# Patient Record
Sex: Female | Born: 1985 | Race: Black or African American | Hispanic: No | Marital: Single | Smoking: Former smoker
Health system: Southern US, Community
[De-identification: ages and names within clinical notes are randomized; demographics above are authoritative.]

## PROBLEM LIST (undated history)

## (undated) ENCOUNTER — Inpatient Hospital Stay (HOSPITAL_COMMUNITY): Payer: Self-pay

## (undated) DIAGNOSIS — K8020 Calculus of gallbladder without cholecystitis without obstruction: Secondary | ICD-10-CM

## (undated) DIAGNOSIS — F419 Anxiety disorder, unspecified: Secondary | ICD-10-CM

## (undated) DIAGNOSIS — E785 Hyperlipidemia, unspecified: Secondary | ICD-10-CM

## (undated) DIAGNOSIS — I2699 Other pulmonary embolism without acute cor pulmonale: Secondary | ICD-10-CM

## (undated) DIAGNOSIS — O24419 Gestational diabetes mellitus in pregnancy, unspecified control: Secondary | ICD-10-CM

## (undated) DIAGNOSIS — R7303 Prediabetes: Secondary | ICD-10-CM

## (undated) DIAGNOSIS — F32A Depression, unspecified: Secondary | ICD-10-CM

## (undated) DIAGNOSIS — D649 Anemia, unspecified: Secondary | ICD-10-CM

## (undated) DIAGNOSIS — I10 Essential (primary) hypertension: Secondary | ICD-10-CM

## (undated) HISTORY — PX: APPENDECTOMY: SHX54

## (undated) HISTORY — DX: Calculus of gallbladder without cholecystitis without obstruction: K80.20

## (undated) HISTORY — DX: Prediabetes: R73.03

## (undated) HISTORY — DX: Essential (primary) hypertension: I10

## (undated) HISTORY — DX: Hyperlipidemia, unspecified: E78.5

---

## 2009-04-23 ENCOUNTER — Inpatient Hospital Stay (HOSPITAL_COMMUNITY): Admission: EM | Admit: 2009-04-23 | Discharge: 2009-04-28 | Payer: Self-pay | Admitting: Emergency Medicine

## 2009-04-25 ENCOUNTER — Encounter (INDEPENDENT_AMBULATORY_CARE_PROVIDER_SITE_OTHER): Payer: Self-pay | Admitting: Internal Medicine

## 2009-04-25 ENCOUNTER — Ambulatory Visit: Payer: Self-pay | Admitting: Vascular Surgery

## 2011-01-30 LAB — LUPUS ANTICOAGULANT PANEL
DRVVT: 36.2 secs (ref 36.1–47.0)
Lupus Anticoagulant: NOT DETECTED

## 2011-01-30 LAB — CBC
HCT: 37.4 % (ref 36.0–46.0)
HCT: 37.9 % (ref 36.0–46.0)
HCT: 38.8 % (ref 36.0–46.0)
HCT: 40.4 % (ref 36.0–46.0)
HCT: 40.6 % (ref 36.0–46.0)
Hemoglobin: 12.1 g/dL (ref 12.0–15.0)
Hemoglobin: 12.5 g/dL (ref 12.0–15.0)
Hemoglobin: 12.5 g/dL (ref 12.0–15.0)
Hemoglobin: 13.1 g/dL (ref 12.0–15.0)
Hemoglobin: 13.4 g/dL (ref 12.0–15.0)
MCHC: 32.2 g/dL (ref 30.0–36.0)
MCHC: 32.4 g/dL (ref 30.0–36.0)
MCHC: 33 g/dL (ref 30.0–36.0)
MCHC: 33.1 g/dL (ref 30.0–36.0)
MCHC: 33.3 g/dL (ref 30.0–36.0)
MCV: 75.8 fL — ABNORMAL LOW (ref 78.0–100.0)
MCV: 76 fL — ABNORMAL LOW (ref 78.0–100.0)
MCV: 76.8 fL — ABNORMAL LOW (ref 78.0–100.0)
MCV: 76.9 fL — ABNORMAL LOW (ref 78.0–100.0)
Platelets: 248 10*3/uL (ref 150–400)
RBC: 4.87 MIL/uL (ref 3.87–5.11)
RBC: 4.89 MIL/uL (ref 3.87–5.11)
RBC: 4.92 MIL/uL (ref 3.87–5.11)
RBC: 5.31 MIL/uL — ABNORMAL HIGH (ref 3.87–5.11)
RDW: 13.6 % (ref 11.5–15.5)
RDW: 13.7 % (ref 11.5–15.5)
RDW: 13.8 % (ref 11.5–15.5)
RDW: 13.9 % (ref 11.5–15.5)
WBC: 5.8 10*3/uL (ref 4.0–10.5)
WBC: 7.1 10*3/uL (ref 4.0–10.5)

## 2011-01-30 LAB — ANTITHROMBIN III: AntiThromb III Func: 113 % (ref 76–126)

## 2011-01-30 LAB — BASIC METABOLIC PANEL
CO2: 23 mEq/L (ref 19–32)
Chloride: 104 mEq/L (ref 96–112)
Chloride: 108 mEq/L (ref 96–112)
GFR calc Af Amer: >60 mL/min (ref >60)
GFR calc non Af Amer: >60 mL/min (ref >60)
Glucose, Bld: 121 mg/dL — ABNORMAL HIGH (ref 70–99)
Potassium: 4.1 mEq/L (ref 3.5–5.1)
Potassium: 4.3 mEq/L (ref 3.5–5.1)
Sodium: 139 mEq/L (ref 135–145)

## 2011-01-30 LAB — POCT I-STAT, CHEM 8
Calcium, Ion: 1.21 mmol/L (ref 1.12–1.32)
Glucose, Bld: 129 mg/dL — ABNORMAL HIGH (ref 70–99)
HCT: 40 % (ref 36.0–46.0)
Hemoglobin: 13.6 g/dL (ref 12.0–15.0)
Potassium: 3.9 mEq/L (ref 3.5–5.1)
TCO2: 26 mmol/L (ref 0–100)

## 2011-01-30 LAB — HEMOGLOBIN A1C: Hgb A1c MFr Bld: 6.4 % — ABNORMAL HIGH (ref 4.6–6.1)

## 2011-01-30 LAB — D-DIMER, QUANTITATIVE: D-Dimer, Quant: 2.16 ug/mL-FEU — ABNORMAL HIGH (ref 0.00–0.48)

## 2011-01-30 LAB — DIFFERENTIAL
Basophils Absolute: 0 10*3/uL (ref 0.0–0.1)
Basophils Absolute: 0 10*3/uL (ref 0.0–0.1)
Basophils Relative: 1 % (ref 0–1)
Basophils Relative: 1 % (ref 0–1)
Basophils Relative: 1 % (ref 0–1)
Eosinophils Absolute: 0.2 10*3/uL (ref 0.0–0.7)
Eosinophils Absolute: 0.3 10*3/uL (ref 0.0–0.7)
Eosinophils Relative: 3 % (ref 0–5)
Eosinophils Relative: 3 % (ref 0–5)
Eosinophils Relative: 4 % (ref 0–5)
Lymphocytes Relative: 30 % (ref 12–46)
Lymphs Abs: 3 10*3/uL (ref 0.7–4.0)
Monocytes Absolute: 0.4 10*3/uL (ref 0.1–1.0)
Monocytes Relative: 7 % (ref 3–12)
Neutro Abs: 3 10*3/uL (ref 1.7–7.7)
Neutrophils Relative %: 48 % (ref 43–77)

## 2011-01-30 LAB — BETA-2-GLYCOPROTEIN I ABS, IGG/M/A: Beta-2 Glyco I IgG: 4 U/mL (ref <20)

## 2011-01-30 LAB — PROTIME-INR
INR: 1 (ref 0.00–1.49)
INR: 1.2 (ref 0.00–1.49)
INR: 1.4 (ref 0.00–1.49)
Prothrombin Time: 15.4 seconds — ABNORMAL HIGH (ref 11.6–15.2)
Prothrombin Time: 23.1 seconds — ABNORMAL HIGH (ref 11.6–15.2)

## 2011-01-30 LAB — T4, FREE: Free T4: 0.9 ng/dL (ref 0.80–1.80)

## 2011-01-30 LAB — CARDIOLIPIN ANTIBODIES, IGG, IGM, IGA
Anticardiolipin IgA: <7 [APL'U] — ABNORMAL LOW (ref <13)
Anticardiolipin IgG: <7 [GPL'U] — ABNORMAL LOW (ref <11)
Anticardiolipin IgM: <7 [MPL'U] — ABNORMAL LOW (ref <10)

## 2011-03-08 NOTE — H&P (Signed)
NAMESOLSTICE, LASTINGER NO.:  [id number]   MEDICAL RECORD NO.:  [id number]          PATIENT TYPE:  EMS   LOCATION:  MAJO                         FACILITY:  [facility]   PHYSICIAN:  Vania Rea, M.D. DATE OF BIRTH:  [birth year]   DATE OF ADMISSION:  04/23/2009  DATE OF DISCHARGE:                              HISTORY & PHYSICAL   PRIMARY CARE PHYSICIAN:  [facility] in [place].   CHIEF COMPLAINT:  Chest pain and shortness of breath since midday today.   HISTORY OF PRESENT ILLNESS:  This is an obese 25 year old African  American lady who works as a Psychologist, forensic, sitting down all day, and  does smoke 3 to 6 cigarettes per day but has otherwise been in good  health until sudden onset of central chest pain associated with  shortness of breath today.  The pain is worsened by exertion and has not  been improving.  She eventually decided to call the EMS, who brought her  to the emergency room, where she had a CT angiogram done, which  confirmed an acute pulmonary embolus.  The hospitalist service was  called to assist with management.   The patient has no family history of clotting disorder.  She has no  history of recent travel.  She does not use any type of contraceptive.  She does have irregular menstruation.  Her last period was on June 20th.  It was minimal for only 2 days, and she cannot remember when she had  menstruation before that.   PAST MEDICAL HISTORY:  None.   MEDICATIONS:  None.   ALLERGIES:  No known drug allergies.   SOCIAL HISTORY:  She is married with 2 small children.  She smokes 3 to  6 cigarettes per day.  Denies alcohol or illicit drug use.   FAMILY HISTORY:  Significant for hypertension.   REVIEW OF SYSTEMS:  On a 10-point review of systems, other than noted  above, unremarkable.   PHYSICAL EXAMINATION:  An obese young African American lady lying on the  stretcher in no acute distress.  She gives her height as 5 feet  8  inches, and her weight is 318 pounds.  Temperature 98, pulse 96, respirations 20,  blood pressure 136/73.  She  is saturating at 100% on room air.  Pupils are equal and round.  Mucous membranes are pink and anicteric.  She is not dehydrated.  No cervical lymphadenopathy or thyromegaly.  She does have a very thick  neck.  CHEST:  Clear to auscultation bilaterally.  CARDIOVASCULAR:  Regular rhythm.  No murmur heard.  ABDOMEN:  Massively obese but soft and nontender.  EXTREMITIES:  The right lower extremity is slightly enlarged, compared  to the left, but there is no tenderness.  Dorsalis pedis pulses are  equal bilaterally.  SKIN:  Warm and dry without ulcerations.  CENTRAL NERVOUS SYSTEM:  Cranial nerves II-XII are grossly intact.  She  has no focal neurologic deficits.   LABS:  CBC is reviewed.  Her white count is 6.9, hemoglobin 12.8, MCV  76, platelets 248.  She has a  normal differential on her white count.  Her cardiac enzymes are completely normal with undetectable troponins  and CK-MB, and a myoglobin of 60.  Her urine pregnancy test is negative.  Her chemistry is significant for a glucose of 129.  It is otherwise  unremarkable.  Her D-dimer is elevated to 2.16.  Her PT/INR is 13.5 and  1.   Her 2-view chest x-ray shows no acute disease.   CT angiogram of the chest shows bilateral pulmonary emboli with a large  clot in the distal aspect of the left main pulmonary artery.  A clot is  also identified in the descending right interlobar artery.  Heart size  is normal.  Lungs are clear.   ASSESSMENT:  1. Acute bilateral pulmonary embolus.  2. Probable right lower extremity deep venous thrombosis.  3. Tobacco abuse.  4. Morbid obesity.  5. Hyperglycemia.   PLAN:  Will admit this lady for anticoagulation with Lovenox and will  also start Coumadin.  A hypercoagulable workup was ordered started.  Once patient is confirmed to be stable, she can likely be discharged  home on  Lovenox and Coumadin to be followed by her primary care  physician.  Other plans as per orders.      Vania Rea, M.D.  Electronically Signed     LC/MEDQ  D:  04/23/2009  T:  04/23/2009  Job:  [id number]   cc:   [facility] [address] [employer], [place]  Billee Cashing, [place]

## 2011-03-08 NOTE — Discharge Summary (Signed)
Crystal Compton, PINT NO.:  [id number]   MEDICAL RECORD NO.:  [id number]          PATIENT TYPE:  INP   LOCATION:  3740                         FACILITY:  [facility]   PHYSICIAN:  Acey Lav, MD  DATE OF BIRTH:  [birth year]   DATE OF ADMISSION:  04/23/2009  DATE OF DISCHARGE:                               DISCHARGE SUMMARY   INTERIM DISCHARGE SUMMARY:   PRIMARY CARE PHYSICIAN:  [facility] in [place].   DISCHARGE DIAGNOSES:  1. Acute pulmonary embolism.  2. Diabetes mellitus, non-insulin dependent.  3. Morbid obesity.  4. Tobacco use.   PERTINENT LABORATORY DATA AND STUDIES:  CT scan of the chest, CT  angiogram done on April 23, 2009, shows pulmonary embolism, bilateral,  with large clot in the distal aspect of the left main pulmonary artery.  Clot also identified in the descending right interlobular artery.  Dopplers done on April 25, 2009, showed no evidence of deep venous  thrombosis, superficial thrombus involving right lower extremity or left  lower extremity, no evidence of Baker's cyst.  Hypercoagulable panel:  Antithrombin 113, protein-C 92 , functional C 135,  protein S 102 and  functional S 75.  Lupus anticoagulant not detected.  Homocystine 5.2.  Hemoglobin A1c was 6.4.  Thyroid-stimulating hormone 4.052.  Initial  blood glucose on I-stat was 129 and several serum glucoses fasting the  morning were in the 120 range.  Most recent one this morning was 125.   HOSPITAL COURSE:  The patient was admitted on April 23, 2009, to the  hospitalist service after coming in with acute onset of chest pain,  shortness of breath.  Chest pain came on with onset while she was at  rest at work and then continue throughout the day.  It was central and  pleuritic and accompanied by dyspnea.  She came to emergency room where  a CT angiogram showed large pulmonary emboli and the patient was started  on anticoagulation with Lovenox.  She was also  started on Coumadin  according to protocol.  Her INR was 1.9 on April 27, 2009, and we are  rechecking her coags tomorrow.  She is without need of home oxygen and  is hemodynamically stable and when her INR becomes therapeutic, she will  be ready for discharge.   DISCHARGE MEDICATIONS:  1. Coumadin dose to be determined by pharmacy, most recent dose was 10      mg on April 26, 2009.  2. Oxycodone 5 mg as needed every 4 to 6 hours for pain.  3. Ambien 5 mg to 10 mg at time as needed for sleep.   DISCHARGE INSTRUCTIONS:  The patient will need to follow-up with  physician who can monitor INR and make sure this therapeutic.  She will  need follow-up for her diabetes mellitus. I will defer initiation of  metformin for the time-being to her primary care physician.  She may  potentially be able to ameliorate this losing weight.      Acey Lav, MD  Electronically Signed     CV/MEDQ  D:  04/27/2009  T:  04/27/2009  Job:  [id number]

## 2012-09-17 ENCOUNTER — Encounter (HOSPITAL_BASED_OUTPATIENT_CLINIC_OR_DEPARTMENT_OTHER): Payer: Self-pay | Admitting: Family Medicine

## 2012-09-17 ENCOUNTER — Emergency Department (HOSPITAL_BASED_OUTPATIENT_CLINIC_OR_DEPARTMENT_OTHER)
Admission: EM | Admit: 2012-09-17 | Discharge: 2012-09-17 | Disposition: A | Discharge status: Home / Self Care | Payer: Self-pay | Attending: Emergency Medicine | Admitting: Emergency Medicine

## 2012-09-17 DIAGNOSIS — H109 Unspecified conjunctivitis: Secondary | ICD-10-CM

## 2012-09-17 DIAGNOSIS — Z23 Encounter for immunization: Secondary | ICD-10-CM | POA: Insufficient documentation

## 2012-09-17 DIAGNOSIS — H53149 Visual discomfort, unspecified: Secondary | ICD-10-CM | POA: Insufficient documentation

## 2012-09-17 MED ORDER — TETRACAINE HCL 0.5 % OP SOLN
OPHTHALMIC | Status: AC
  Administered 2012-09-17: 11:00:00
  Filled 2012-09-17: qty 2

## 2012-09-17 MED ORDER — OFLOXACIN 0.3 % OP SOLN: 1.0000 [drp] | Freq: Four times a day (QID) | OPHTHALMIC | Status: DC

## 2012-09-17 MED ORDER — OXYCODONE-ACETAMINOPHEN 5-325 MG PO TABS: 2.0000 | ORAL_TABLET | ORAL | Status: DC | PRN

## 2012-09-17 MED ORDER — FLUORESCEIN SODIUM 1 MG OP STRP
ORAL_STRIP | OPHTHALMIC | Status: AC
  Administered 2012-09-17: 11:00:00
  Filled 2012-09-17: qty 1

## 2012-09-17 MED ORDER — TETANUS-DIPHTH-ACELL PERTUSSIS 5-2.5-18.5 LF-MCG/0.5 IM SUSP
0.5000 mL | Freq: Once | INTRAMUSCULAR | Status: AC
  Administered 2012-09-17: 0.5 mL via INTRAMUSCULAR
  Filled 2012-09-17: qty 0.5

## 2012-09-17 MED ORDER — CYCLOPENTOLATE HCL 1 % OP SOLN
2.0000 [drp] | Freq: Once | OPHTHALMIC | Status: AC
  Administered 2012-09-17: 2 [drp] via OPHTHALMIC
  Filled 2012-09-17: qty 2

## 2012-09-17 NOTE — ED Provider Notes (Signed)
History     CSN: [id number]  Arrival date & time 09/17/12  1012   First MD Initiated Contact with Patient 09/17/12 1043      Chief Complaint  Patient presents with  . Eye Problem    (Consider location/radiation/quality/duration/timing/severity/associated sxs/prior treatment) HPI Comments: Patient comes in complaining of a two-day history of light sensitivity pain and watery discharge to her right thigh. She states it's been getting worse since yesterday. She does wear contact lenses and left her contact lenses in for the last week. She took about 2 days ago and the symptoms started after that. She denies any fevers. Denies any crusting of her eyes. Denies any vision changes or eye. She currently does not have an ophthalmologist. She states that she gets her contacts from [place].  Patient is a 26 y.o. female presenting with eye problem.  Eye Problem  Associated symptoms include discharge, photophobia and eye redness. Pertinent negatives include no nausea and no vomiting.    History reviewed. No pertinent past medical history.  History reviewed. No pertinent past surgical history.  No family history on file.  History  Substance Use Topics  . Smoking status: Never Smoker   . Smokeless tobacco: Not on file  . Alcohol Use: Yes    OB History    Grav Para Term Preterm Abortions TAB SAB Ect Mult Living                  Review of Systems  Constitutional: Negative for fever.  Eyes: Positive for photophobia, discharge and redness. Negative for visual disturbance.  Gastrointestinal: Negative for nausea and vomiting.  Skin: Negative for rash and wound.    Allergies  Review of patient's allergies indicates no known allergies.  Home Medications   Current Outpatient Rx  Name  Route  Sig  Dispense  Refill  . OFLOXACIN 0.3 % OP SOLN   Both Eyes   Place 1 drop into both eyes 4 (four) times daily.   5 mL   0   . OXYCODONE-ACETAMINOPHEN 5-325 MG PO TABS   Oral   Take 2  tablets by mouth every 4 (four) hours as needed for pain.   15 tablet   0     BP 142/83  Pulse 67  Temp 98.4 F (36.9 C) (Oral)  Resp 22  Ht 5\' 7"  (1.702 m)  Wt 270 lb (122.471 kg)  BMI 42.29 kg/m2  SpO2 100%  LMP 08/22/2012  Physical Exam  Constitutional: She appears well-developed and well-nourished.  HENT:  Head: Normocephalic.  Mouth/Throat: Oropharynx is clear and moist.  Eyes: Pupils are equal, round, and reactive to light.       Patient diffuse redness to the conjunctiva of the right eye. There some watery discharge. There is photophobia on the right. Extraocular eye movements are intact. There's no foreign bodies noted. Tetracaine and 4 seen was placed in the right eye and patient had a slit lamp exam which did not reveal any staining areas. No obvious corneal abrasion was noted. Visual acuity was noted to be 20/70 in the right eye and 20/100 in the left eye. This was done without any glasses or contacts.    ED Course  Procedures (including critical care time)  Labs Reviewed - No data to display No results found.   1. Conjunctivitis       MDM  Patient diffuse redness and photophobia to the right eye. I don't see a definite corneal abrasion or corneal ulcer. This could represent iritis  versus conjunctivitis. I spoke with Dr. Delaney Meigs who advised placing the patient on Ocuflox eyedrops. Advised patient to throw a all her contacts and contact supplies. I advised her to followup with Dr. Delaney Meigs if her symptoms worsen tomorrow or are not improving within next few days. And I did give her Dr. Ronnie Doss contact information. Her tetanus shot was updated and she was given a perception for pain medicine as well.        Rolan Bucco, MD 09/17/12 [phone]

## 2012-09-17 NOTE — ED Notes (Signed)
Pt sts she wore her contact lenses for a week and was not supposed to. Pt sts she developed pain to eyes yesterday, took out contact lenses and pain has worsened. Pt reports extreme sensitivity to light.

## 2015-04-29 ENCOUNTER — Encounter (HOSPITAL_BASED_OUTPATIENT_CLINIC_OR_DEPARTMENT_OTHER): Payer: Self-pay | Admitting: *Deleted

## 2015-04-29 ENCOUNTER — Emergency Department (HOSPITAL_BASED_OUTPATIENT_CLINIC_OR_DEPARTMENT_OTHER)
Admission: EM | Admit: 2015-04-29 | Discharge: 2015-04-29 | Disposition: A | Discharge status: Home / Self Care | Payer: 59 | Attending: Emergency Medicine | Admitting: Emergency Medicine

## 2015-04-29 DIAGNOSIS — O9989 Other specified diseases and conditions complicating pregnancy, childbirth and the puerperium: Secondary | ICD-10-CM | POA: Insufficient documentation

## 2015-04-29 DIAGNOSIS — O2391 Unspecified genitourinary tract infection in pregnancy, first trimester: Secondary | ICD-10-CM | POA: Diagnosis not present

## 2015-04-29 DIAGNOSIS — Z3A08 8 weeks gestation of pregnancy: Secondary | ICD-10-CM | POA: Insufficient documentation

## 2015-04-29 DIAGNOSIS — R1030 Lower abdominal pain, unspecified: Secondary | ICD-10-CM | POA: Insufficient documentation

## 2015-04-29 DIAGNOSIS — N390 Urinary tract infection, site not specified: Secondary | ICD-10-CM

## 2015-04-29 DIAGNOSIS — Z86711 Personal history of pulmonary embolism: Secondary | ICD-10-CM | POA: Diagnosis not present

## 2015-04-29 DIAGNOSIS — Z3490 Encounter for supervision of normal pregnancy, unspecified, unspecified trimester: Secondary | ICD-10-CM

## 2015-04-29 HISTORY — DX: Other pulmonary embolism without acute cor pulmonale: I26.99

## 2015-04-29 LAB — CBC WITH DIFFERENTIAL/PLATELET
BASOS ABS: 0 10*3/uL (ref 0.0–0.1)
BASOS PCT: 0 % (ref 0–1)
EOS ABS: 0.1 10*3/uL (ref 0.0–0.7)
EOS PCT: 1 % (ref 0–5)
HEMATOCRIT: 35.9 % — AB (ref 36.0–46.0)
HEMOGLOBIN: 11.9 g/dL — AB (ref 12.0–15.0)
LYMPHS PCT: 31 % (ref 12–46)
Lymphs Abs: 2.7 10*3/uL (ref 0.7–4.0)
MCH: 24.7 pg — ABNORMAL LOW (ref 26.0–34.0)
MCHC: 33.1 g/dL (ref 30.0–36.0)
MCV: 74.6 fL — ABNORMAL LOW (ref 78.0–100.0)
MONO ABS: 0.6 10*3/uL (ref 0.1–1.0)
MONOS PCT: 7 % (ref 3–12)
NEUTROS ABS: 5.2 10*3/uL (ref 1.7–7.7)
NEUTROS PCT: 61 % (ref 43–77)
Platelets: 286 10*3/uL (ref 150–400)
RBC: 4.81 MIL/uL (ref 3.87–5.11)
RDW: 13.7 % (ref 11.5–15.5)
WBC: 8.6 10*3/uL (ref 4.0–10.5)

## 2015-04-29 LAB — BASIC METABOLIC PANEL
Anion gap: 8 (ref 5–15)
BUN: 12 mg/dL (ref 6–20)
CO2: 21 mmol/L — ABNORMAL LOW (ref 22–32)
CREATININE: 0.67 mg/dL (ref 0.44–1.00)
Calcium: 8.9 mg/dL (ref 8.9–10.3)
Chloride: 105 mmol/L (ref 101–111)
GFR calc Af Amer: >60 mL/min (ref >60)
GFR calc non Af Amer: >60 mL/min (ref >60)
Glucose, Bld: 83 mg/dL (ref 65–99)
Potassium: 4 mmol/L (ref 3.5–5.1)
SODIUM: 134 mmol/L — AB (ref 135–145)

## 2015-04-29 LAB — URINALYSIS, ROUTINE W REFLEX MICROSCOPIC
BILIRUBIN URINE: NEGATIVE
GLUCOSE, UA: NEGATIVE mg/dL
KETONES UR: 15 mg/dL — AB
Nitrite: NEGATIVE
Protein, ur: NEGATIVE mg/dL
Specific Gravity, Urine: 1.037 — ABNORMAL HIGH (ref 1.005–1.030)
Urobilinogen, UA: 1 mg/dL (ref 0.0–1.0)
pH: 6 (ref 5.0–8.0)

## 2015-04-29 LAB — URINE MICROSCOPIC-ADD ON

## 2015-04-29 LAB — PREGNANCY, URINE: PREG TEST UR: POSITIVE — AB

## 2015-04-29 MED ORDER — PROMETHAZINE HCL 25 MG PO TABS: 25.0000 mg | ORAL_TABLET | Freq: Four times a day (QID) | ORAL | Status: DC | PRN

## 2015-04-29 MED ORDER — ONDANSETRON HCL 4 MG/2ML IJ SOLN
4.0000 mg | Freq: Once | INTRAMUSCULAR | Status: AC
  Administered 2015-04-29: 4 mg via INTRAVENOUS
  Filled 2015-04-29: qty 2

## 2015-04-29 MED ORDER — SODIUM CHLORIDE 0.9 % IV BOLUS (SEPSIS)
2000.0000 mL | Freq: Once | INTRAVENOUS | Status: AC
  Administered 2015-04-29: 2000 mL via INTRAVENOUS

## 2015-04-29 MED ORDER — CEFTRIAXONE SODIUM 1 G IJ SOLR
INTRAMUSCULAR | Status: AC
  Filled 2015-04-29: qty 10

## 2015-04-29 MED ORDER — CEPHALEXIN 500 MG PO CAPS
500.0000 mg | ORAL_CAPSULE | Freq: Three times a day (TID) | ORAL | Status: DC
Start: 2015-04-29 — End: 2016-06-06

## 2015-04-29 MED ORDER — CEPHALEXIN 500 MG PO CAPS: 500.0000 mg | ORAL_CAPSULE | Freq: Three times a day (TID) | ORAL | Status: DC

## 2015-04-29 MED ORDER — DEXTROSE 5 % IV SOLN
1.0000 g | Freq: Once | INTRAVENOUS | Status: AC
  Administered 2015-04-29: 1 g via INTRAVENOUS

## 2015-04-29 NOTE — Discharge Instructions (Signed)

## 2015-04-29 NOTE — ED Provider Notes (Signed)
CSN: [id number]     Arrival date & time 04/29/15  1804 History  This chart was scribed for Rolland PorterMark Kenetra Hildenbrand, MD by Chestine SporeSoijett Blue, ED Scribe. The patient was seen in room MH09/MH09 at 6:28 PM      Chief Complaint  Patient presents with  . Abdominal Pain      The history is provided by the patient. No language interpreter was used.    Jim LikeLaportia Yoo is a 29 y.o. female who presents to the Emergency Department complaining of abdominal pain onset 3 weeks. Pt reports that she took a home pregnancy test and she thinks that she is about [redacted] weeks pregnant and that is when her LMP was. Pt had a positive pregnancy test 3 weeks ago. Pt is having associated symptoms of nausea, weakness, dizziness, constipation, and cramping. Pt reports that this is her fouth pregnancy and she did experience the same symptoms with her other pregnancies. She denies vomiting, urinary issues, dysuria, vaginal bleeding, fever, and any other symptoms. Pt denies stomach or gallbladder issues.    Past Medical History  Diagnosis Date  . PE (pulmonary embolism)    Past Surgical History  Procedure Laterality Date  . Appendectomy     No family history on file. History  Substance Use Topics  . Smoking status: Never Smoker   . Smokeless tobacco: Not on file  . Alcohol Use: Yes   OB History    Gravida Para Term Preterm AB TAB SAB Ectopic Multiple Living   4 2 2  1           Review of Systems  Constitutional: Negative for fever, chills, diaphoresis, appetite change and fatigue.  HENT: Negative for mouth sores, sore throat and trouble swallowing.   Eyes: Negative for visual disturbance.  Respiratory: Negative for cough, chest tightness, shortness of breath and wheezing.   Cardiovascular: Negative for chest pain.  Gastrointestinal: Positive for nausea, abdominal pain and constipation. Negative for vomiting, diarrhea and abdominal distention.  Endocrine: Negative for polydipsia, polyphagia and polyuria.  Genitourinary: Negative  for dysuria, frequency, hematuria, decreased urine volume and vaginal bleeding.  Musculoskeletal: Negative for gait problem.  Skin: Negative for color change, pallor and rash.  Neurological: Positive for weakness. Negative for dizziness, syncope, light-headedness and headaches.  Hematological: Does not bruise/bleed easily.  Psychiatric/Behavioral: Negative for behavioral problems and confusion.      Allergies  Review of patient's allergies indicates no known allergies.  Home Medications   Prior to Admission medications   Medication Sig Start Date End Date Taking? Authorizing Provider  cephALEXin (KEFLEX) 500 MG capsule Take 1 capsule (500 mg total) by mouth 3 (three) times daily. 04/29/15   Rolland PorterMark Geary Rufo, MD  ofloxacin (OCUFLOX) 0.3 % ophthalmic solution Place 1 drop into both eyes 4 (four) times daily. 09/17/12   Rolan BuccoMelanie Belfi, MD  oxyCODONE-acetaminophen (PERCOCET/ROXICET) 5-325 MG per tablet Take 2 tablets by mouth every 4 (four) hours as needed for pain. 09/17/12   Rolan BuccoMelanie Belfi, MD  promethazine (PHENERGAN) 25 MG tablet Take 1 tablet (25 mg total) by mouth every 6 (six) hours as needed for nausea or vomiting. 04/29/15   Rolland PorterMark Moya Duan, MD   BP 130/79 mmHg  Pulse 64  Temp(Src) 98.4 F (36.9 C) (Oral)  Resp 18  Ht 5\' 7"  (1.702 m)  Wt 270 lb (122.471 kg)  BMI 42.28 kg/m2  SpO2 100%  LMP 03/06/2015 Physical Exam  Constitutional: She is oriented to person, place, and time. She appears well-developed and well-nourished. No distress.  HENT:  Head: Normocephalic.  Eyes: Conjunctivae are normal. Pupils are equal, round, and reactive to light. No scleral icterus.  Neck: Normal range of motion. Neck supple. No thyromegaly present.  Cardiovascular: Normal rate and regular rhythm.  Exam reveals no gallop and no friction rub.   No murmur heard. Pulmonary/Chest: Effort normal and breath sounds normal. No respiratory distress. She has no wheezes. She has no rales.  Abdominal: Soft. Bowel sounds  are normal. She exhibits no distension. There is tenderness in the suprapubic area. There is no rebound.  Minimal suprapubic tenderness  Musculoskeletal: Normal range of motion.  Neurological: She is alert and oriented to person, place, and time.  Skin: Skin is warm and dry. No rash noted.  Psychiatric: She has a normal mood and affect. Her behavior is normal.  Nursing note and vitals reviewed.   ED Course  Procedures (including critical care time) DIAGNOSTIC STUDIES: Oxygen Saturation is 100% on RA, nl by my interpretation.    COORDINATION OF CARE: 6:33 PM-Discussed treatment plan which includes UA with pt at bedside and pt agreed to plan.   Labs Review Labs Reviewed  URINALYSIS, ROUTINE W REFLEX MICROSCOPIC (NOT AT [facility]) - Abnormal; Notable for the following:    APPearance CLOUDY (*)    Specific Gravity, Urine 1.037 (*)    Hgb urine dipstick MODERATE (*)    Ketones, ur 15 (*)    Leukocytes, UA MODERATE (*)    All other components within normal limits  PREGNANCY, URINE - Abnormal; Notable for the following:    Preg Test, Ur POSITIVE (*)    All other components within normal limits  CBC WITH DIFFERENTIAL/PLATELET - Abnormal; Notable for the following:    Hemoglobin 11.9 (*)    HCT 35.9 (*)    MCV 74.6 (*)    MCH 24.7 (*)    All other components within normal limits  BASIC METABOLIC PANEL - Abnormal; Notable for the following:    Sodium 134 (*)    CO2 21 (*)    All other components within normal limits  URINE MICROSCOPIC-ADD ON - Abnormal; Notable for the following:    Bacteria, UA MANY (*)    All other components within normal limits  URINE CULTURE    Imaging Review No results found.   EKG Interpretation None      MDM   Final diagnoses:  Pregnancy  UTI (lower urinary tract infection)    Bedside US shows IUP with FHT 128.  Tests show UTI.  Hydrated.  Taking PO.  Plan home, push fluids, antiemetics, antibiotics, OB f.u.  I personally performed the services  described in this documentation, which was scribed in my presence. The recorded information has been reviewed and is accurate.    Rolland Porter, MD 04/29/15 1949

## 2015-04-29 NOTE — ED Notes (Signed)
MD at bedside. 

## 2015-04-29 NOTE — ED Notes (Signed)
Pt c/o abd cramping, N/V, weakness, and dizziness x3 weeks.

## 2015-04-29 NOTE — ED Notes (Signed)
Pt believes she is about [redacted] weeks pregnant.

## 2015-05-01 LAB — URINE CULTURE: Special Requests: NORMAL

## 2015-05-08 NOTE — ED Notes (Signed)
Patient called to state she is having pain in her arm where the IV was started last week.  States she has soreness in the entire arm.  Denies redness, swelling or warmth.  Encouraged to apply warm compresses every two hours through out the day and to return to the ED immediately if develops redness, swelling, warmness or fever.

## 2015-10-13 ENCOUNTER — Encounter: Payer: Self-pay | Admitting: Advanced Practice Midwife

## 2015-10-14 ENCOUNTER — Encounter: Payer: Self-pay | Admitting: Obstetrics and Gynecology

## 2015-10-14 ENCOUNTER — Ambulatory Visit (INDEPENDENT_AMBULATORY_CARE_PROVIDER_SITE_OTHER): Payer: Medicaid Other | Admitting: Obstetrics and Gynecology

## 2015-10-14 ENCOUNTER — Other Ambulatory Visit (HOSPITAL_COMMUNITY)
Admission: RE | Admit: 2015-10-14 | Discharge: 2015-10-14 | Disposition: A | Discharge status: Home / Self Care | Payer: Medicaid Other | Source: Ambulatory Visit | Attending: Obstetrics and Gynecology | Admitting: Obstetrics and Gynecology

## 2015-10-14 VITALS — BP 130/77 | HR 84 | Temp 98.3°F | Wt 269.9 lb

## 2015-10-14 DIAGNOSIS — Z01419 Encounter for gynecological examination (general) (routine) without abnormal findings: Secondary | ICD-10-CM

## 2015-10-14 DIAGNOSIS — Z0000 Encounter for general adult medical examination without abnormal findings: Secondary | ICD-10-CM

## 2015-10-14 DIAGNOSIS — Z1151 Encounter for screening for human papillomavirus (HPV): Secondary | ICD-10-CM | POA: Diagnosis not present

## 2015-10-14 DIAGNOSIS — Z124 Encounter for screening for malignant neoplasm of cervix: Secondary | ICD-10-CM | POA: Diagnosis not present

## 2015-10-14 DIAGNOSIS — Z113 Encounter for screening for infections with a predominantly sexual mode of transmission: Secondary | ICD-10-CM

## 2015-10-14 LAB — HEMOGLOBIN A1C
Hgb A1c MFr Bld: 5.8 % — ABNORMAL HIGH (ref <5.7)
Mean Plasma Glucose: 120 mg/dL — ABNORMAL HIGH (ref <117)

## 2015-10-14 LAB — TSH: TSH: 2.517 u[IU]/mL (ref 0.350–4.500)

## 2015-10-14 MED ORDER — SERTRALINE HCL 100 MG PO TABS: 100.0000 mg | ORAL_TABLET | Freq: Every day | ORAL | Status: DC

## 2015-10-14 NOTE — Progress Notes (Signed)
  Subjective:     Crystal Compton is a 29 y.o. female with BMI 42 who is here for a comprehensive physical exam. The patient reports multiple complaints. She describes experiencing some depression. She has never been treated for it in the past. She denies any suicidal/homicidal ideations and desires to be seen by a provider. She also reports monthly lower pelvic pain which occur 2 weeks following her menstrual cycle. Her menses are monthly lasting for 5 days. She is sexually active without contraception and would welcome a pregnancy. Patient would like to be screened for STD, as well as diabetes.  Past Medical History  Diagnosis Date  . PE (pulmonary embolism)    Past Surgical History  Procedure Laterality Date  . Appendectomy     No family history on file.  Social History   Social History  . Marital Status: Single    Spouse Name: N/A  . Number of Children: N/A  . Years of Education: N/A   Occupational History  . Not on file.   Social History Main Topics  . Smoking status: Never Smoker   . Smokeless tobacco: Not on file  . Alcohol Use: Yes  . Drug Use: No  . Sexual Activity: Not on file   Other Topics Concern  . Not on file   Social History Narrative   Health Maintenance  Topic Date Due  . HIV Screening  06/28/2000  . PAP SMEAR  06/28/2006  . INFLUENZA VACCINE  05/25/2015  . TETANUS/TDAP  09/17/2022       Review of Systems A comprehensive review of systems was negative.   Objective:      GENERAL: Well-developed, well-nourished female in no acute distress. Obese HEENT: Normocephalic, atraumatic. Sclerae anicteric.  NECK: Supple. Normal thyroid.  LUNGS: Clear to auscultation bilaterally.  HEART: Regular rate and rhythm. BREASTS: Symmetric in size. No palpable masses or lymphadenopathy, skin changes, or nipple drainage. ABDOMEN: Soft, nontender, nondistended. No organomegaly. PELVIC: Normal external female genitalia. Vagina is pink and rugated.  Normal  discharge. Normal appearing cervix. Uterus is normal in size. No adnexal mass or tenderness. EXTREMITIES: No cyanosis, clubbing, or edema, 2+ distal pulses.    Assessment:    Healthy female exam.      Plan:    Pap smear collected with cultures HIV, Syphilis and hepatitis screen ordered HgA1C and TSH ordered Rx Zoloft provided as well as a referral to behavioral health Reassurance provided regarding her monthly abdominal pain which is likely ovulatory pain Discussed weight loss management with diet and exercise (at least 150 minutes/week) Patient will be contacted with any abnormal results RTC prn See After Visit Summary for Counseling Recommendations

## 2015-10-15 ENCOUNTER — Telehealth: Payer: Self-pay

## 2015-10-15 ENCOUNTER — Other Ambulatory Visit: Payer: Self-pay

## 2015-10-15 LAB — WET PREP, GENITAL
Trich, Wet Prep: NONE SEEN
Yeast Wet Prep HPF POC: NONE SEEN

## 2015-10-15 LAB — RPR

## 2015-10-15 LAB — CYTOLOGY - PAP

## 2015-10-15 LAB — HIV ANTIBODY (ROUTINE TESTING W REFLEX): HIV 1&2 Ab, 4th Generation: NONREACTIVE

## 2015-10-15 LAB — URINE CULTURE: Colony Count: 40000

## 2015-10-15 LAB — HEPATITIS C ANTIBODY: HCV Ab: NEGATIVE

## 2015-10-15 LAB — HEPATITIS B SURFACE ANTIGEN: Hepatitis B Surface Ag: NEGATIVE

## 2015-10-15 MED ORDER — METRONIDAZOLE 500 MG PO TABS: 500.0000 mg | ORAL_TABLET | Freq: Two times a day (BID) | ORAL | Status: DC

## 2015-10-15 NOTE — Telephone Encounter (Signed)
Spoke with patient concerning results on lab work. Flagyl has been called into the pharmacy.

## 2015-10-15 NOTE — Telephone Encounter (Signed)
Pt notified of results and rx has been called into pharmacy

## 2015-10-15 NOTE — Telephone Encounter (Signed)
-----   Message from Catalina AntiguaPeggy Constant, MD sent at 10/15/2015  8:31 AM EST ----- Please inform patient of BV infection. Please call in Flagyl 500mg  BID for 7 days. (no pharmacy on file to e-prescribe)  Please inform patient that although she does not have diabetes, she is in high risk for the development of diabetes. Lifestyle modifications such as a better diet and exercise (as we discussed) will help prevent the development of that disease.  Thanks  K[employer] Morgan EnergyPeggy

## 2015-10-20 ENCOUNTER — Telehealth: Payer: Self-pay | Admitting: *Deleted

## 2015-10-20 NOTE — Telephone Encounter (Signed)
Per Dr. Jolayne Pantheronstant: Please inform patient of trich infection. The antibiotic received to treat BV should treat her trich as well. She should however, inform her partner and have him treated and abstain from intercourse until they have both completed treatment Called patient and left message that we are calling with results.

## 2015-10-21 NOTE — Telephone Encounter (Signed)
Spoke with patient she is aware of trich infection and will tell her partner as well.

## 2016-05-17 ENCOUNTER — Other Ambulatory Visit (HOSPITAL_COMMUNITY): Payer: Self-pay | Admitting: Obstetrics and Gynecology

## 2016-05-17 DIAGNOSIS — Z3689 Encounter for other specified antenatal screening: Secondary | ICD-10-CM

## 2016-05-17 DIAGNOSIS — F1290 Cannabis use, unspecified, uncomplicated: Secondary | ICD-10-CM

## 2016-05-18 ENCOUNTER — Encounter (HOSPITAL_COMMUNITY): Payer: Self-pay

## 2016-05-19 ENCOUNTER — Encounter (HOSPITAL_COMMUNITY): Payer: Self-pay

## 2016-05-19 ENCOUNTER — Ambulatory Visit (HOSPITAL_COMMUNITY)
Admission: RE | Admit: 2016-05-19 | Discharge: 2016-05-19 | Disposition: A | Discharge status: Home / Self Care | Payer: Medicaid Other | Source: Ambulatory Visit | Attending: Obstetrics and Gynecology | Admitting: Obstetrics and Gynecology

## 2016-05-19 VITALS — BP 121/72 | HR 128 | Wt 296.4 lb

## 2016-05-19 DIAGNOSIS — Z3689 Encounter for other specified antenatal screening: Secondary | ICD-10-CM

## 2016-05-19 DIAGNOSIS — Z36 Encounter for antenatal screening of mother: Secondary | ICD-10-CM | POA: Insufficient documentation

## 2016-05-19 DIAGNOSIS — O09213 Supervision of pregnancy with history of pre-term labor, third trimester: Secondary | ICD-10-CM | POA: Diagnosis not present

## 2016-05-19 DIAGNOSIS — Z3A28 28 weeks gestation of pregnancy: Secondary | ICD-10-CM | POA: Diagnosis not present

## 2016-05-19 DIAGNOSIS — O09293 Supervision of pregnancy with other poor reproductive or obstetric history, third trimester: Secondary | ICD-10-CM | POA: Diagnosis not present

## 2016-05-19 DIAGNOSIS — O99213 Obesity complicating pregnancy, third trimester: Secondary | ICD-10-CM | POA: Diagnosis not present

## 2016-05-19 DIAGNOSIS — O24415 Gestational diabetes mellitus in pregnancy, controlled by oral hypoglycemic drugs: Secondary | ICD-10-CM | POA: Diagnosis not present

## 2016-05-19 DIAGNOSIS — O24919 Unspecified diabetes mellitus in pregnancy, unspecified trimester: Secondary | ICD-10-CM

## 2016-05-19 DIAGNOSIS — F1290 Cannabis use, unspecified, uncomplicated: Secondary | ICD-10-CM

## 2016-05-19 HISTORY — DX: Gestational diabetes mellitus in pregnancy, unspecified control: O24.419

## 2016-05-19 HISTORY — DX: Anemia, unspecified: D64.9

## 2016-06-06 ENCOUNTER — Encounter (HOSPITAL_COMMUNITY): Payer: Self-pay | Admitting: *Deleted

## 2016-06-06 ENCOUNTER — Inpatient Hospital Stay (HOSPITAL_COMMUNITY)
Admission: AD | Admit: 2016-06-06 | Discharge: 2016-06-06 | Disposition: A | Discharge status: Home / Self Care | Payer: Medicaid Other | Source: Ambulatory Visit | Attending: Obstetrics & Gynecology | Admitting: Obstetrics & Gynecology

## 2016-06-06 DIAGNOSIS — L299 Pruritus, unspecified: Secondary | ICD-10-CM | POA: Diagnosis present

## 2016-06-06 DIAGNOSIS — Z86711 Personal history of pulmonary embolism: Secondary | ICD-10-CM | POA: Insufficient documentation

## 2016-06-06 DIAGNOSIS — O26893 Other specified pregnancy related conditions, third trimester: Secondary | ICD-10-CM | POA: Insufficient documentation

## 2016-06-06 DIAGNOSIS — Z3A31 31 weeks gestation of pregnancy: Secondary | ICD-10-CM | POA: Insufficient documentation

## 2016-06-06 LAB — COMPREHENSIVE METABOLIC PANEL
ALK PHOS: 93 U/L (ref 38–126)
ALT: 10 U/L — AB (ref 14–54)
ANION GAP: 8 (ref 5–15)
AST: 12 U/L — ABNORMAL LOW (ref 15–41)
Albumin: 3.3 g/dL — ABNORMAL LOW (ref 3.5–5.0)
BUN: 7 mg/dL (ref 6–20)
CO2: 21 mmol/L — ABNORMAL LOW (ref 22–32)
CREATININE: 0.54 mg/dL (ref 0.44–1.00)
Calcium: 9.4 mg/dL (ref 8.9–10.3)
Chloride: 106 mmol/L (ref 101–111)
GFR calc Af Amer: >60 mL/min (ref >60)
GFR calc non Af Amer: >60 mL/min (ref >60)
GLUCOSE: 86 mg/dL (ref 65–99)
Potassium: 4 mmol/L (ref 3.5–5.1)
Sodium: 135 mmol/L (ref 135–145)
TOTAL PROTEIN: 6.8 g/dL (ref 6.5–8.1)
Total Bilirubin: 0.4 mg/dL (ref 0.3–1.2)

## 2016-06-06 NOTE — MAU Note (Signed)
Pt has had itching in her hands and arms and legs and a rash appears after her scratching.  She said it goes away about ten minutes later.  Has had some follow up with her OB in [place] but still has some symptoms and wants to be retested for her symptoms due to her concerns.

## 2016-06-06 NOTE — Discharge Instructions (Signed)
Pruritus °Pruritus is an itching feeling. There are many different conditions and factors that can make your skin itchy. Dry skin is one of the most common causes of itching. Most cases of itching do not require medical attention. Itchy skin can turn into a rash.  °HOME CARE INSTRUCTIONS  °Watch your pruritus for any changes. Take these steps to help with your condition:  °Skin Care °· Moisturize your skin as needed. A moisturizer that contains petroleum jelly is best for keeping moisture in your skin. °· Take or apply medicines only as directed by your health care provider. This may include: °¨ Corticosteroid cream. °¨ Anti-itch lotions. °¨ Oral anti-histamines. °· Apply cool compresses to the affected areas. °· Try taking a bath with: °¨ Epsom salts. Follow the instructions on the packaging. You can get these at your local pharmacy or grocery store. °¨ Baking soda. Pour a small amount into the bath as directed by your health care provider. °¨ Colloidal oatmeal. Follow the instructions on the packaging. You can get this at your local pharmacy or grocery store. °· Try applying baking soda paste to your skin. Stir water into baking soda until it reaches a paste-like consistency.   °· Do not scratch your skin. °· Avoid hot showers or baths, which can make itching worse. A cold shower may help with itching as long as you use a moisturizer after. °· Avoid scented soaps, detergents, and perfumes. Use gentle soaps, detergents, perfumes, and other cosmetic products. °General Instructions °· Avoid wearing tight clothes. °· Keep a journal to help track what causes your itch. Write down: °¨ What you eat. °¨ What cosmetic products you use. °¨ What you drink. °¨ What you wear. This includes jewelry. °· Use a humidifier. This keeps the air moist, which helps to prevent dry skin. °SEEK MEDICAL CARE IF: °· The itching does not go away after several days. °· You sweat at night. °· You have weight loss. °· You are unusually  thirsty. °· You urinate more than normal. °· You are more tired than normal. °· You have abdominal pain. °· Your skin tingles. °· You feel weak. °· Your skin or the whites of your eyes look yellow (jaundice). °· Your skin feels numb. °  °This information is not intended to replace advice given to you by your health care provider. Make sure you discuss any questions you have with your health care provider. °  °Document Released: 06/22/2011 Document Revised: 02/24/2015 Document Reviewed: 10/06/2014 °Elsevier Interactive Patient Education ©2016 [employer] ° °

## 2016-06-06 NOTE — MAU Provider Note (Signed)
History     CSN: [id number]  Arrival date and time: 06/06/16 1656   First Provider Initiated Contact with Patient 06/06/16 1812      Chief Complaint  Patient presents with  . Pruritis   HPI Ms. Crystal Compton is a 30 y.o. G4P2010 at 7529w2d who presents to MAU today with complaint of itching. The patient states that she has been having progressively worsening diffuse itching for 1-2 weeks. She had her OB, Dr. Shawnie Ponsorn, check bile acids recently and was called and told results were normal. She took Benadryl x 2 days which relieved her symptoms, but did not take any today and is itching again. She denies N/V, regular contractions, vaginal bleeding, LOF today. She reports good fetal movement. She would like labs redrawn because she is not convinced that she does not have cholestasis.   OB History    Gravida Para Term Preterm AB Living   4 2 2   1      SAB TAB Ectopic Multiple Live Births                  Past Medical History:  Diagnosis Date  . Anemia   . Gestational diabetes   . PE (pulmonary embolism)     Past Surgical History:  Procedure Laterality Date  . APPENDECTOMY      No family history on file.  Social History  Substance Use Topics  . Smoking status: Never Smoker  . Smokeless tobacco: Never Used  . Alcohol use Yes    Allergies: No Known Allergies  No prescriptions prior to admission.    Review of Systems  Constitutional: Negative for fever and malaise/fatigue.  Gastrointestinal: Negative for abdominal pain, nausea and vomiting.  Genitourinary:       Neg - vaginal bleeding, discharge, LOF  Skin: Positive for itching. Negative for rash.   Physical Exam   Blood pressure 112/73, pulse 95, temperature 98.3 F (36.8 C), temperature source Oral, resp. rate 16, last menstrual period 10/27/2014.  Physical Exam  Nursing note and vitals reviewed. Constitutional: She is oriented to person, place, and time. She appears well-developed and well-nourished. No distress.   HENT:  Head: Normocephalic and atraumatic.  Eyes: Conjunctivae are normal.  Cardiovascular: Normal rate.   Respiratory: Effort normal.  GI: Soft. She exhibits no distension.  Neurological: She is alert and oriented to person, place, and time.  Skin: Skin is warm and dry. No erythema.  Psychiatric: She has a normal mood and affect.    Results for orders placed or performed during the hospital encounter of 06/06/16 (from the past 24 hour(s))  Comprehensive metabolic panel     Status: Abnormal   Collection Time: 06/06/16  6:09 PM  Result Value Ref Range   Sodium 135 135 - 145 mmol/L   Potassium 4.0 3.5 - 5.1 mmol/L   Chloride 106 101 - 111 mmol/L   CO2 21 (L) 22 - 32 mmol/L   Glucose, Bld 86 65 - 99 mg/dL   BUN 7 6 - 20 mg/dL   Creatinine, Ser 9.520.54 0.44 - 1.00 mg/dL   Calcium 9.4 8.9 - 84.110.3 mg/dL   Total Protein 6.8 6.5 - 8.1 g/dL   Albumin 3.3 (L) 3.5 - 5.0 g/dL   AST 12 (L) 15 - 41 U/L   ALT 10 (L) 14 - 54 U/L   Alkaline Phosphatase 93 38 - 126 U/L   Total Bilirubin 0.4 0.3 - 1.2 mg/dL   GFR calc non Af Amer >60 >60 mL/min  GFR calc Af Amer >60 >60 mL/min   Anion gap 8 5 - 15    MAU Course  Procedures None  MDM CMP and Bile acids drawn. Patient advised that results would not be available today. Discussed importance of staying with one provider for care during pregnancy. Advised that she could transfer to our care if desired. Patient will wait on lab results first.  LFTs normal Bile acids pending Assessment and Plan  A: SIUP at 4712w2d Diffuse pruritis  P: Discharge home Advised to continue Benadryl PRN for itching Warning signs for worsening condition discussed Patient advised to follow-up with Dr. Shawnie Ponsorn as scheduled or sooner if symptoms worsen Discussed importance of consistent care in pregnancy Patient may return to MAU as needed or if her condition were to change or worsen   Marny LowensteinJulie N Wenzel, PA-C  06/06/2016, 7:54 PM

## 2016-06-07 LAB — BILE ACIDS, TOTAL: Bile Acids Total: 9.1 umol/L (ref 4.7–24.5)

## 2016-06-15 ENCOUNTER — Ambulatory Visit (HOSPITAL_COMMUNITY)
Admission: RE | Admit: 2016-06-15 | Discharge: 2016-06-15 | Disposition: A | Discharge status: Home / Self Care | Payer: Medicaid Other | Source: Ambulatory Visit | Attending: Obstetrics and Gynecology | Admitting: Obstetrics and Gynecology

## 2016-06-15 ENCOUNTER — Encounter (HOSPITAL_COMMUNITY): Payer: Self-pay

## 2016-06-15 VITALS — BP 123/76 | HR 109 | Wt 297.4 lb

## 2016-06-15 DIAGNOSIS — O24414 Gestational diabetes mellitus in pregnancy, insulin controlled: Secondary | ICD-10-CM | POA: Diagnosis present

## 2016-06-15 DIAGNOSIS — O09213 Supervision of pregnancy with history of pre-term labor, third trimester: Secondary | ICD-10-CM | POA: Insufficient documentation

## 2016-06-15 DIAGNOSIS — O24415 Gestational diabetes mellitus in pregnancy, controlled by oral hypoglycemic drugs: Secondary | ICD-10-CM | POA: Insufficient documentation

## 2016-06-15 DIAGNOSIS — Z3A32 32 weeks gestation of pregnancy: Secondary | ICD-10-CM | POA: Insufficient documentation

## 2016-06-15 DIAGNOSIS — Z36 Encounter for antenatal screening of mother: Secondary | ICD-10-CM | POA: Insufficient documentation

## 2016-06-15 DIAGNOSIS — O24919 Unspecified diabetes mellitus in pregnancy, unspecified trimester: Secondary | ICD-10-CM

## 2016-06-15 DIAGNOSIS — O09293 Supervision of pregnancy with other poor reproductive or obstetric history, third trimester: Secondary | ICD-10-CM | POA: Diagnosis not present

## 2016-07-13 ENCOUNTER — Encounter (HOSPITAL_COMMUNITY): Payer: Self-pay

## 2016-07-13 ENCOUNTER — Ambulatory Visit (HOSPITAL_COMMUNITY)
Admission: RE | Admit: 2016-07-13 | Discharge: 2016-07-13 | Disposition: A | Discharge status: Home / Self Care | Payer: Medicaid Other | Source: Ambulatory Visit | Attending: Obstetrics and Gynecology | Admitting: Obstetrics and Gynecology

## 2016-07-13 DIAGNOSIS — O2690 Pregnancy related conditions, unspecified, unspecified trimester: Secondary | ICD-10-CM | POA: Insufficient documentation

## 2016-07-13 DIAGNOSIS — O09219 Supervision of pregnancy with history of pre-term labor, unspecified trimester: Secondary | ICD-10-CM | POA: Insufficient documentation

## 2016-07-13 DIAGNOSIS — O24414 Gestational diabetes mellitus in pregnancy, insulin controlled: Secondary | ICD-10-CM | POA: Diagnosis not present

## 2016-07-13 DIAGNOSIS — O09299 Supervision of pregnancy with other poor reproductive or obstetric history, unspecified trimester: Secondary | ICD-10-CM | POA: Insufficient documentation

## 2016-07-13 DIAGNOSIS — Z3A36 36 weeks gestation of pregnancy: Secondary | ICD-10-CM | POA: Insufficient documentation

## 2016-07-13 DIAGNOSIS — O99213 Obesity complicating pregnancy, third trimester: Secondary | ICD-10-CM | POA: Diagnosis not present

## 2016-07-13 DIAGNOSIS — O24415 Gestational diabetes mellitus in pregnancy, controlled by oral hypoglycemic drugs: Secondary | ICD-10-CM | POA: Insufficient documentation

## 2017-03-23 ENCOUNTER — Encounter (HOSPITAL_COMMUNITY): Payer: Self-pay

## 2017-04-23 ENCOUNTER — Emergency Department (HOSPITAL_BASED_OUTPATIENT_CLINIC_OR_DEPARTMENT_OTHER)
Admission: EM | Admit: 2017-04-23 | Discharge: 2017-04-23 | Disposition: A | Discharge status: Home / Self Care | Payer: Medicaid Other | Attending: Emergency Medicine | Admitting: Emergency Medicine

## 2017-04-23 ENCOUNTER — Encounter (HOSPITAL_BASED_OUTPATIENT_CLINIC_OR_DEPARTMENT_OTHER): Payer: Self-pay | Admitting: Emergency Medicine

## 2017-04-23 DIAGNOSIS — O24434 Gestational diabetes mellitus in the puerperium, insulin controlled: Secondary | ICD-10-CM | POA: Diagnosis not present

## 2017-04-23 DIAGNOSIS — Z794 Long term (current) use of insulin: Secondary | ICD-10-CM | POA: Insufficient documentation

## 2017-04-23 DIAGNOSIS — J020 Streptococcal pharyngitis: Secondary | ICD-10-CM

## 2017-04-23 DIAGNOSIS — J029 Acute pharyngitis, unspecified: Secondary | ICD-10-CM | POA: Diagnosis present

## 2017-04-23 LAB — RAPID STREP SCREEN (MED CTR MEBANE ONLY): STREPTOCOCCUS, GROUP A SCREEN (DIRECT): POSITIVE — AB

## 2017-04-23 MED ORDER — PENICILLIN G BENZATHINE & PROC 1200000 UNIT/2ML IM SUSP
1.2000 10*6.[IU] | Freq: Once | INTRAMUSCULAR | Status: AC
  Administered 2017-04-23: 1.2 10*6.[IU] via INTRAMUSCULAR
  Filled 2017-04-23: qty 2

## 2017-04-23 NOTE — Discharge Instructions (Signed)
Please read and follow all provided instructions.  Your diagnoses today include:  1. Strep pharyngitis     Tests performed today include: Strep test: was POSITIVE for strep throat Vital signs. See below for your results today.   Medications prescribed:  You were given a one-time shot of penicillin to treat your strep throat.   Home care instructions:  Please read the educational materials provided and follow any instructions contained in this packet.  Follow-up instructions: Please follow-up with your primary care provider as needed for further evaluation of your symptoms.  Return instructions:  Please return to the Emergency Department if you experience worsening symptoms.  Return if you have worsening problems swallowing, your neck becomes swollen, you cannot swallow your saliva or your voice becomes muffled.  Return with high persistent fever, persistent vomiting, or if you have trouble breathing.  Please return if you have any other emergent concerns.  Additional Information:  Your vital signs today were: BP 132/81 (BP Location: Left Arm)    Pulse 88    Temp 99.9 F (37.7 C) (Oral)    Resp 18    Ht 5\' 7"  (1.702 m)    Wt (!) 140.6 kg (310 lb)    LMP 04/18/2017 (Exact Date)    SpO2 100%    BMI 48.55 kg/m  If your blood pressure (BP) was elevated above 135/85 this visit, please have this repeated by your doctor within one month. --------------

## 2017-04-23 NOTE — ED Triage Notes (Signed)
Sore throat since yesterday.

## 2017-04-23 NOTE — ED Notes (Signed)
No adverse effects noted to IM injection.  

## 2017-04-23 NOTE — ED Notes (Signed)
Pt given d/c instructions as per chart. Verbalizes understanding. No questions. 

## 2017-04-23 NOTE — ED Provider Notes (Signed)
[facility]   CSN: [id number] Arrival date & time: 04/23/17  1303     History   Chief Complaint Chief Complaint  Patient presents with  . Sore Throat    HPI Crystal Compton is a 31 y.o. female.  HPI  31 y.o. female presents to the Emergency Department today due to sore throat x 1 day. Notes fevers. Unsure of temp. No N/V/D. No CP/SOB/ABD pain. States pain with swallowing, but able to swallow PO. Notes no cough or congestion. No rhinorrhea. No ear drainage. Noted sick contacts with children who have a sore throat. Rates pain 3/10. Sore feeling. No other symptoms noted.    Past Medical History:  Diagnosis Date  . Anemia   . Gestational diabetes   . PE (pulmonary embolism)     There are no active problems to display for this patient.   Past Surgical History:  Procedure Laterality Date  . APPENDECTOMY      OB History    Gravida Para Term Preterm AB Living   4 2 2   1 2   SAB TAB Ectopic Multiple Live Births                   Home Medications    Prior to Admission medications   Medication Sig Start Date End Date Taking? Authorizing Provider  ferrous sulfate 325 (65 FE) MG tablet Take 325 mg by mouth daily with breakfast. 05/18/16   [provider]  glyBURIDE (DIABETA) 2.5 MG tablet Take 2.5 mg by mouth daily with breakfast.    [provider]  insulin NPH Human (HUMULIN N,NOVOLIN N) 100 UNIT/ML injection Inject 12 Units into the skin at bedtime. 05/18/16   [provider]  sertraline (ZOLOFT) 100 MG tablet Take 1 tablet (100 mg total) by mouth daily. Patient not taking: Reported on 07/13/2016 10/14/15   Constant, Peggy, MD    Family History No family history on file.  Social History Social History  Substance Use Topics  . Smoking status: Never Smoker  . Smokeless tobacco: Never Used  . Alcohol use No     Allergies   Patient has no known allergies.   Review of Systems Review of Systems    Constitutional: Positive for fever.  HENT: Positive for sore throat. Negative for congestion, sinus pain and trouble swallowing.   Respiratory: Negative for shortness of breath.   Cardiovascular: Negative for chest pain.  Gastrointestinal: Negative for nausea and vomiting.  Allergic/Immunologic: Negative for immunocompromised state.   Physical Exam Updated Vital Signs BP 132/81 (BP Location: Left Arm)   Pulse 88   Temp 99.9 F (37.7 C) (Oral)   Resp 18   Ht 5' 7" (1.702 m)   Wt (!) 140.6 kg (310 lb)   LMP 04/18/2017 (Exact Date)   SpO2 100%   BMI 48.55 kg/m   Physical Exam  Constitutional: She is oriented to person, place, and time. Vital signs are normal. She appears well-developed and well-nourished. No distress.  HENT:  Head: Normocephalic and atraumatic.  Right Ear: Hearing, tympanic membrane, external ear and ear canal normal.  Left Ear: Hearing, tympanic membrane, external ear and ear canal normal.  Nose: Nose normal.  Mouth/Throat: Uvula is midline, oropharynx is clear and moist and mucous membranes are normal. No trismus in the jaw. No oropharyngeal exudate, posterior oropharyngeal erythema or tonsillar abscesses.  Posterior oropharynx with erythema and exudate. No edema. Tolerating secretions. Uvula midline. No trismus.  Eyes: Conjunctivae and EOM are   normal. Pupils are equal, round, and reactive to light.  Neck: Normal range of motion. Neck supple. No tracheal deviation present.  Cardiovascular: Normal rate, regular rhythm, S1 normal, S2 normal, normal heart sounds, intact distal pulses and normal pulses.   Pulmonary/Chest: Effort normal and breath sounds normal. No respiratory distress. She has no decreased breath sounds. She has no wheezes. She has no rhonchi. She has no rales.  Abdominal: Normal appearance and bowel sounds are normal. There is no tenderness.  Musculoskeletal: Normal range of motion.  Neurological: She is alert and oriented to person, place, and  time.  Skin: Skin is warm and dry.  Psychiatric: She has a normal mood and affect. Her speech is normal and behavior is normal. Thought content normal.  Nursing note and vitals reviewed.    ED Treatments / Results  Labs (all labs ordered are listed, but only abnormal results are displayed) Labs Reviewed  RAPID STREP SCREEN (NOT AT [facility]) - Abnormal; Notable for the following:       Result Value   Streptococcus, Group A Screen (Direct) POSITIVE (*)    All other components within normal limits    EKG  EKG Interpretation None       Radiology No results found.  Procedures Procedures (including critical care time)  Medications Ordered in ED Medications - No data to display   Initial Impression / Assessment and Plan / ED Course  I have reviewed the triage vital signs and the nursing notes.  Pertinent labs & imaging results that were available during my care of the patient were reviewed by me and considered in my medical decision making (see chart for details).  Final Clinical Impressions(s) / ED Diagnoses  {I have reviewed and evaluated the relevant laboratory values.   {I have reviewed the relevant previous healthcare records.  {I obtained HPI from historian.   ED Course:  Assessment: Four of four CENTOR criteria met  (x) Fever (x) Cervical adenopathy (x) Tonsillar/pharyngeal exudate (x) No cough   Strep screen positive.  Penicillin IM given in ED.    Disposition/Plan:  DC Home Additional Verbal discharge instructions given and discussed with patient.  Pt Instructed to f/u with PCP in the next week for evaluation and treatment of symptoms. Return precautions given Pt acknowledges and agrees with plan  Supervising Physician Tegeler, Christopher J, *  Final diagnoses:  Strep pharyngitis    New Prescriptions New Prescriptions   No medications on file     Mohr, Tyler, PA-C 04/23/17 1521    Tegeler, Christopher J, MD 04/23/17 1713  

## 2018-10-28 ENCOUNTER — Encounter (HOSPITAL_BASED_OUTPATIENT_CLINIC_OR_DEPARTMENT_OTHER): Payer: Self-pay | Admitting: Emergency Medicine

## 2018-10-28 ENCOUNTER — Emergency Department (HOSPITAL_BASED_OUTPATIENT_CLINIC_OR_DEPARTMENT_OTHER): Payer: 59

## 2018-10-28 ENCOUNTER — Other Ambulatory Visit: Payer: Self-pay

## 2018-10-28 ENCOUNTER — Emergency Department (HOSPITAL_BASED_OUTPATIENT_CLINIC_OR_DEPARTMENT_OTHER)
Admission: EM | Admit: 2018-10-28 | Discharge: 2018-10-29 | Disposition: A | Discharge status: Home / Self Care | Payer: 59 | Attending: Emergency Medicine | Admitting: Emergency Medicine

## 2018-10-28 DIAGNOSIS — Y939 Activity, unspecified: Secondary | ICD-10-CM | POA: Insufficient documentation

## 2018-10-28 DIAGNOSIS — Y929 Unspecified place or not applicable: Secondary | ICD-10-CM | POA: Insufficient documentation

## 2018-10-28 DIAGNOSIS — X58XXXA Exposure to other specified factors, initial encounter: Secondary | ICD-10-CM | POA: Diagnosis not present

## 2018-10-28 DIAGNOSIS — Y999 Unspecified external cause status: Secondary | ICD-10-CM | POA: Diagnosis not present

## 2018-10-28 DIAGNOSIS — Z79899 Other long term (current) drug therapy: Secondary | ICD-10-CM | POA: Diagnosis not present

## 2018-10-28 DIAGNOSIS — R109 Unspecified abdominal pain: Secondary | ICD-10-CM | POA: Diagnosis present

## 2018-10-28 DIAGNOSIS — S39012A Strain of muscle, fascia and tendon of lower back, initial encounter: Secondary | ICD-10-CM | POA: Diagnosis not present

## 2018-10-28 DIAGNOSIS — E119 Type 2 diabetes mellitus without complications: Secondary | ICD-10-CM | POA: Diagnosis not present

## 2018-10-28 DIAGNOSIS — Z794 Long term (current) use of insulin: Secondary | ICD-10-CM | POA: Insufficient documentation

## 2018-10-28 LAB — CBC WITH DIFFERENTIAL/PLATELET
ABS IMMATURE GRANULOCYTES: 0.03 10*3/uL (ref 0.00–0.07)
Basophils Absolute: 0 10*3/uL (ref 0.0–0.1)
Basophils Relative: 0 %
Eosinophils Absolute: 0.2 10*3/uL (ref 0.0–0.5)
Eosinophils Relative: 2 %
HCT: 37.3 % (ref 36.0–46.0)
HEMOGLOBIN: 11.2 g/dL — AB (ref 12.0–15.0)
Immature Granulocytes: 0 %
Lymphocytes Relative: 32 %
Lymphs Abs: 2.4 10*3/uL (ref 0.7–4.0)
MCH: 23.7 pg — ABNORMAL LOW (ref 26.0–34.0)
MCHC: 30 g/dL (ref 30.0–36.0)
MCV: 79 fL — ABNORMAL LOW (ref 80.0–100.0)
Monocytes Absolute: 0.4 10*3/uL (ref 0.1–1.0)
Monocytes Relative: 6 %
NEUTROS ABS: 4.4 10*3/uL (ref 1.7–7.7)
NEUTROS PCT: 60 %
Platelets: 256 10*3/uL (ref 150–400)
RBC: 4.72 MIL/uL (ref 3.87–5.11)
RDW: 14.4 % (ref 11.5–15.5)
WBC: 7.4 10*3/uL (ref 4.0–10.5)
nRBC: 0 % (ref 0.0–0.2)

## 2018-10-28 LAB — BASIC METABOLIC PANEL
ANION GAP: 6 (ref 5–15)
BUN: 13 mg/dL (ref 6–20)
CO2: 24 mmol/L (ref 22–32)
Calcium: 9 mg/dL (ref 8.9–10.3)
Chloride: 108 mmol/L (ref 98–111)
Creatinine, Ser: 0.7 mg/dL (ref 0.44–1.00)
GFR calc Af Amer: >60 mL/min (ref >60)
GFR calc non Af Amer: >60 mL/min (ref >60)
Glucose, Bld: 109 mg/dL — ABNORMAL HIGH (ref 70–99)
Potassium: 3.8 mmol/L (ref 3.5–5.1)
Sodium: 138 mmol/L (ref 135–145)

## 2018-10-28 LAB — URINALYSIS, ROUTINE W REFLEX MICROSCOPIC
BILIRUBIN URINE: NEGATIVE
Glucose, UA: NEGATIVE mg/dL
Ketones, ur: 15 mg/dL — AB
LEUKOCYTES UA: NEGATIVE
Nitrite: NEGATIVE
PROTEIN: NEGATIVE mg/dL
SPECIFIC GRAVITY, URINE: 1.025 (ref 1.005–1.030)
pH: 6.5 (ref 5.0–8.0)

## 2018-10-28 LAB — URINALYSIS, MICROSCOPIC (REFLEX)

## 2018-10-28 LAB — PREGNANCY, URINE: Preg Test, Ur: NEGATIVE

## 2018-10-28 LAB — D-DIMER, QUANTITATIVE: D-Dimer, Quant: 0.31 ug/mL-FEU (ref 0.00–0.50)

## 2018-10-28 MED ORDER — IBUPROFEN 800 MG PO TABS
800.0000 mg | ORAL_TABLET | Freq: Once | ORAL | Status: AC
  Administered 2018-10-28: 800 mg via ORAL
  Filled 2018-10-28: qty 1

## 2018-10-28 NOTE — ED Notes (Signed)
Patient transported to CT 

## 2018-10-28 NOTE — ED Provider Notes (Signed)
[facility] EMERGENCY DEPARTMENT Provider Note   CSN: [id number] Arrival date & time: 10/28/18  2230     History   Chief Complaint Chief Complaint  Patient presents with  . Back Pain    HPI Crystal Compton is a 33 y.o. female.  Patient presents with right-sided flank pain that onset about 10 AM after she stood from a sitting position.  Denies any falls, trauma or lifting injury.  The pain is constant, worse with palpation and movement.  She been taking ibuprofen at home without relief.  Nothing makes it better.  No radiation of the pain to her abdomen.  No pain with urination or blood in the urine.  No vaginal bleeding or discharge.  No fever or vomiting.  She had a similar pain several weeks ago was seen at urgent care.  She was told her urinalysis is negative but she was treated with antibiotics anyway and improved.  This pain is in the same location feels worse.  Denies any focal weakness, numbness or tingling.  No bowel or bladder incontinence.  No fever.  Pain does not radiate down her legs no chest pain or shortness of breath.  The history is provided by the patient.  Back Pain   Pertinent negatives include no chest pain, no fever, no headaches, no abdominal pain and no dysuria.    Past Medical History:  Diagnosis Date  . Anemia   . Gestational diabetes   . PE (pulmonary embolism)     There are no active problems to display for this patient.   Past Surgical History:  Procedure Laterality Date  . APPENDECTOMY       OB History    Gravida  4   Para  2   Term  2   Preterm      AB  1   Living  2     SAB      TAB      Ectopic      Multiple      Live Births               Home Medications    Prior to Admission medications   Medication Sig Start Date End Date Taking? Authorizing Provider  ferrous sulfate 325 (65 FE) MG tablet Take 325 mg by mouth daily with breakfast. 05/18/16   [provider]  glyBURIDE (DIABETA) 2.5 MG tablet  Take 2.5 mg by mouth daily with breakfast.    [provider]  insulin NPH Human (HUMULIN N,NOVOLIN N) 100 UNIT/ML injection Inject 12 Units into the skin at bedtime. 05/18/16   [provider]  sertraline (ZOLOFT) 100 MG tablet Take 1 tablet (100 mg total) by mouth daily. Patient not taking: Reported on 07/13/2016 10/14/15   Constant, Gigi Gin, MD    Family History History reviewed. No pertinent family history.  Social History Social History   Tobacco Use  . Smoking status: Never Smoker  . Smokeless tobacco: Never Used  Substance Use Topics  . Alcohol use: No  . Drug use: No     Allergies   Patient has no known allergies.   Review of Systems Review of Systems  Constitutional: Negative for activity change, appetite change and fever.  HENT: Negative for congestion and rhinorrhea.   Respiratory: Negative for cough and shortness of breath.   Cardiovascular: Negative for chest pain.  Gastrointestinal: Negative for abdominal pain, nausea and vomiting.  Genitourinary: Positive for flank pain and hematuria. Negative for dysuria, vaginal bleeding  and vaginal discharge.  Musculoskeletal: Positive for back pain.  Neurological: Negative for dizziness, syncope and headaches.   all other systems are negative except as noted in the HPI and PMH.    Physical Exam Updated Vital Signs BP (!) 141/89 (BP Location: Left Arm)   Pulse 86   Temp 97.8 F (36.6 C) (Oral)   Resp (!) 22   Ht 5\' 7"  (1.702 m)   Wt (!) 142.9 kg   SpO2 99%   BMI 49.34 kg/m   Physical Exam Vitals signs and nursing note reviewed.  Constitutional:      General: She is not in acute distress.    Appearance: She is well-developed.  HENT:     Head: Normocephalic and atraumatic.     Mouth/Throat:     Pharynx: No oropharyngeal exudate.  Eyes:     Conjunctiva/sclera: Conjunctivae normal.     Pupils: Pupils are equal, round, and reactive to light.  Neck:     Musculoskeletal: Normal range of motion  and neck supple.     Comments: No meningismus. Cardiovascular:     Rate and Rhythm: Normal rate and regular rhythm.     Heart sounds: Normal heart sounds. No murmur.  Pulmonary:     Effort: Pulmonary effort is normal. No respiratory distress.     Breath sounds: Normal breath sounds.  Abdominal:     Palpations: Abdomen is soft.     Tenderness: There is no abdominal tenderness. There is no guarding or rebound.  Musculoskeletal: Normal range of motion.        General: Tenderness present.     Comments: R paraspinal lumbar tenderness 5/5 strength in bilateral lower extremities. Ankle plantar and dorsiflexion intact. Great toe extension intact bilaterally. +2 DP and PT pulses. +2 patellar reflexes bilaterally. Normal gait.   Skin:    General: Skin is warm.     Capillary Refill: Capillary refill takes less than 2 seconds.  Neurological:     Mental Status: She is alert and oriented to person, place, and time.     Cranial Nerves: No cranial nerve deficit.     Motor: No abnormal muscle tone.     Coordination: Coordination normal.     Comments: No ataxia on finger to nose bilaterally. No pronator drift. 5/5 strength throughout. CN 2-12 intact.Equal grip strength. Sensation intact.   Psychiatric:        Behavior: Behavior normal.      ED Treatments / Results  Labs (all labs ordered are listed, but only abnormal results are displayed) Labs Reviewed  URINALYSIS, ROUTINE W REFLEX MICROSCOPIC - Abnormal; Notable for the following components:      Result Value   APPearance CLOUDY (*)    Hgb urine dipstick TRACE (*)    Ketones, ur 15 (*)    All other components within normal limits  URINALYSIS, MICROSCOPIC (REFLEX) - Abnormal; Notable for the following components:   Bacteria, UA FEW (*)    All other components within normal limits  CBC WITH DIFFERENTIAL/PLATELET - Abnormal; Notable for the following components:   Hemoglobin 11.2 (*)    MCV 79.0 (*)    MCH 23.7 (*)    All other  components within normal limits  BASIC METABOLIC PANEL - Abnormal; Notable for the following components:   Glucose, Bld 109 (*)    All other components within normal limits  URINE CULTURE  PREGNANCY, URINE  D-DIMER, QUANTITATIVE (NOT [facility])    EKG None  Radiology Ct Renal Stone Study  Result Date: 10/28/2018 CLINICAL DATA:  Initial evaluation for acute right flank pain. EXAM: CT ABDOMEN AND PELVIS WITHOUT CONTRAST TECHNIQUE: Multidetector CT imaging of the abdomen and pelvis was performed following the standard protocol without IV contrast. COMPARISON:  None available. FINDINGS: Lower chest: Visualized lung bases are clear. Hepatobiliary: Diffuse hypoattenuation liver consistent with steatosis. Liver otherwise unremarkable. Gallbladder within normal limits. No biliary dilatation. Pancreas: Pancreas within normal limits. Spleen: Spleen within normal limits. Adrenals/Urinary Tract: Adrenal glands are normal. Kidneys equal in size without evidence for nephrolithiasis or hydronephrosis. No radiopaque calculi seen along the course of either renal collecting system. No hydroureter. Bladder largely decompressed without acute abnormality. No layering stones within the bladder lumen. Stomach/Bowel: Stomach within normal limits. Duodenal diverticulum noted without associated inflammation. No evidence for bowel obstruction. No acute inflammatory changes seen about the bowels. No findings to suggest appendicitis. Vascular/Lymphatic: Intra-abdominal aorta of normal caliber. No adenopathy. Reproductive: Uterus and ovaries within normal limits. 17 mm focus of soft tissue density seen just lateral to the right ovary favored to be related to the gonadal vein, and of doubtful significance in the acute setting. Other: No free air or fluid. Fat containing paraumbilical hernia noted without associated inflammation. Musculoskeletal: No acute osseous abnormality. No discrete lytic or blastic osseous lesions. Small benign  bone island noted within the L4 vertebral body. IMPRESSION: 1. No CT evidence for nephrolithiasis or obstructive uropathy. 2. No other acute intra-abdominal or pelvic process. 3. Hepatic steatosis. Electronically Signed   By: Rise Mu M.D.   On: 10/28/2018 23:55    Procedures Procedures (including critical care time)  Medications Ordered in ED Medications  ibuprofen (ADVIL,MOTRIN) tablet 800 mg (has no administration in time range)     Initial Impression / Assessment and Plan / ED Course  I have reviewed the triage vital signs and the nursing notes.  Pertinent labs & imaging results that were available during my care of the patient were reviewed by me and considered in my medical decision making (see chart for details).    Right-sided low back pain since 10 AM that is worse with palpation and movement.  No neurological deficits.  No pain with urination or blood in the urine.  Similar pain several weeks ago which resolved on its own.  Remote history of pulmonary embolism no longer anticoagulation.  Urinalysis negative for infection or hematuria.  CT scan does not show any kidney stone or other explanation for her pain. D-dimer is negative.  No chest pain or shortness of breath.  Suspect patient may have passed a kidney stone versus musculoskeletal pain.  Will treat empirically with anti-inflammatories and muscle relaxers.  Follow-up with PCP.  Return precautions discussed. Final Clinical Impressions(s) / ED Diagnoses   Final diagnoses:  Strain of lumbar region, initial encounter    ED Discharge Orders    None       Yen Wandell, Jeannett Senior, MD 10/29/18 [phone]

## 2018-10-28 NOTE — ED Triage Notes (Signed)
Pt c/o right flank pain that started at 10am today.

## 2018-10-29 MED ORDER — METHOCARBAMOL 500 MG PO TABS
500.0000 mg | ORAL_TABLET | Freq: Once | ORAL | Status: AC
  Administered 2018-10-29: 500 mg via ORAL
  Filled 2018-10-29: qty 1

## 2018-10-29 MED ORDER — NAPROXEN 500 MG PO TABS: 500.0000 mg | ORAL_TABLET | Freq: Two times a day (BID) | ORAL | 0 refills | Status: DC | PRN

## 2018-10-29 MED ORDER — METHOCARBAMOL 500 MG PO TABS: 500.0000 mg | ORAL_TABLET | Freq: Three times a day (TID) | ORAL | 0 refills | Status: DC | PRN

## 2018-10-29 NOTE — Discharge Instructions (Signed)
There is no evidence of a spinal cord problem.  There is no evidence of kidney stone.  Urine is sent for culture to see if it grows anything but does not appear to be infected today. Take the anti-inflammatories and muscle relaxers as prescribed.  Follow-up with your doctor.  Return to the ED with new or worsening symptoms.

## 2018-10-30 LAB — URINE CULTURE: Culture: <10000 — AB

## 2019-02-28 ENCOUNTER — Other Ambulatory Visit (HOSPITAL_COMMUNITY): Payer: Self-pay | Admitting: Surgery

## 2019-02-28 ENCOUNTER — Other Ambulatory Visit: Payer: Self-pay | Admitting: Surgery

## 2019-03-15 ENCOUNTER — Ambulatory Visit (HOSPITAL_COMMUNITY): Payer: 59

## 2019-03-15 ENCOUNTER — Encounter (HOSPITAL_COMMUNITY): Payer: Self-pay

## 2019-04-19 ENCOUNTER — Ambulatory Visit: Payer: 59 | Admitting: Dietician

## 2019-04-19 ENCOUNTER — Other Ambulatory Visit (HOSPITAL_COMMUNITY): Payer: 59

## 2019-04-25 ENCOUNTER — Ambulatory Visit: Payer: 59 | Admitting: Dietician

## 2019-05-08 ENCOUNTER — Ambulatory Visit: Payer: 59 | Admitting: Dietician

## 2019-05-10 ENCOUNTER — Ambulatory Visit (HOSPITAL_COMMUNITY): Payer: 59

## 2019-05-10 ENCOUNTER — Ambulatory Visit (HOSPITAL_COMMUNITY): Admission: RE | Admit: 2019-05-10 | Payer: 59 | Source: Ambulatory Visit

## 2019-05-19 ENCOUNTER — Emergency Department (HOSPITAL_COMMUNITY): Admission: EM | Admit: 2019-05-19 | Discharge: 2019-05-19 | Discharge status: Absent without leave | Payer: 59

## 2019-06-27 ENCOUNTER — Ambulatory Visit: Payer: 59 | Admitting: Dietician

## 2019-06-27 ENCOUNTER — Ambulatory Visit (HOSPITAL_COMMUNITY): Admission: RE | Admit: 2019-06-27 | Payer: 59 | Source: Ambulatory Visit

## 2019-11-12 ENCOUNTER — Ambulatory Visit: Payer: 59 | Admitting: Dietician

## 2019-11-12 ENCOUNTER — Ambulatory Visit (HOSPITAL_COMMUNITY): Payer: 59

## 2019-12-06 ENCOUNTER — Other Ambulatory Visit: Payer: Self-pay

## 2019-12-06 ENCOUNTER — Ambulatory Visit (HOSPITAL_COMMUNITY)
Admission: RE | Admit: 2019-12-06 | Discharge: 2019-12-06 | Disposition: A | Discharge status: Home / Self Care | Payer: Managed Care, Other (non HMO) | Source: Ambulatory Visit | Attending: Surgery | Admitting: Surgery

## 2019-12-09 ENCOUNTER — Encounter: Payer: Managed Care, Other (non HMO) | Attending: Surgery | Admitting: Dietician

## 2019-12-09 ENCOUNTER — Other Ambulatory Visit: Payer: Self-pay

## 2019-12-09 DIAGNOSIS — E669 Obesity, unspecified: Secondary | ICD-10-CM | POA: Diagnosis not present

## 2019-12-09 NOTE — Progress Notes (Signed)
Nutrition Assessment for Bariatric Surgery Medical Nutrition Therapy  Appt Start Time: 3:30pm    End Time: 4:30pm  Patient was seen on 12/09/2019 for Pre-Operative Nutrition Assessment. Letter of approval faxed to [facility] bariatric surgery program coordinator on 12/10/2019.    Referral stated Supervised Weight Loss (SWL) visits needed: 6  Planned surgery: Sleeve Gastrectomy  Pt expectation of surgery: to lose weight, have better health   NUTRITION ASSESSMENT   Anthropometrics  Start weight at [facility]: 313.5 lbs (date: 12/09/2019) Height: 67 in BMI: 49.1 kg/m2     Lifestyle & Dietary Hx Typical meal pattern is 2 meals per day and maybe 1 snack. May have cereal or oatmeal for breakfast, or may skip. Meals include baked chicken with rice and cabbage, baked spaghetti, chicken alfredo. Snacks include nabs, fruit smoothies, banana, grapes.    24-Hr Dietary Recall First Meal: 2 eggs + 2 pieces sausage  Snack: - Second Meal: [place]ica dish  Snack: fruit smoothie  Third Meal: hamburger steak + mac & cheese + broccoli Snack: - Beverages: water, apple juice, juicy juice   Estimated Energy Needs Calories: 1800 Carbohydrate: 200g Protein: 113g Fat: 60g   NUTRITION DIAGNOSIS  Overweight/obesity ([place]-3.3) related to past poor dietary habits and physical inactivity as evidenced by patient w/ planned Sleeve Gastrectomy surgery following dietary guidelines for continued weight loss.    NUTRITION INTERVENTION  Nutrition counseling (C-1) and education (E-2) to facilitate bariatric surgery goals.  Pre-Op Goals Reviewed with the Patient . Track food and beverage intake (pen and paper, MyFitness Pal, Baritastic app, etc.) . Make healthy food choices while monitoring portion sizes . Consume 3 meals per day or try to eat every 3-5 hours . Avoid concentrated sugars and fried foods . Keep sugar & fat in the single digits per serving on food labels . Practice CHEWING your food (aim  for applesauce consistency) . Practice not drinking 15 minutes before, during, and 30 minutes after each meal and snack . Avoid all carbonated beverages (ex: soda, sparkling beverages)  . Limit caffeinated beverages (ex: coffee, tea, energy drinks) . Avoid all sugar-sweetened beverages (ex: regular soda, sports drinks)  . Avoid alcohol  . Aim for 64-100 ounces of FLUID daily (with at least half of fluid intake being plain water)  . Aim for at least 60-80 grams of PROTEIN daily . Look for a liquid protein source that contains ?15 g protein and ?5 g carbohydrate (ex: shakes, drinks, shots) . Make a list of non-food related activities . Physical activity is an important part of a healthy lifestyle so keep it moving! The goal is to reach 150 minutes of exercise per week, including cardiovascular and weight baring activity.  Handouts Provided Include  . Bariatric Surgery handouts (Nutrition Visits, Pre-Op Goals, Protein Shakes, Vitamins & Minerals)  Learning Style & Readiness for Change Teaching method utilized: Visual & Auditory  Demonstrated degree of understanding via: Teach Back  Barriers to learning/adherence to lifestyle change: None Identified   MONITORING & EVALUATION Dietary intake, weekly physical activity, body weight, and pre-op goals reached at next nutrition visit.   Next Steps Patient is to return to [facility] in 1 month for 1st SWL Visit.

## 2019-12-10 ENCOUNTER — Encounter: Payer: Self-pay | Admitting: Dietician

## 2019-12-10 DIAGNOSIS — E669 Obesity, unspecified: Secondary | ICD-10-CM | POA: Insufficient documentation

## 2020-01-06 ENCOUNTER — Ambulatory Visit: Payer: Managed Care, Other (non HMO) | Admitting: Dietician

## 2020-01-17 ENCOUNTER — Encounter: Payer: Self-pay | Admitting: Dietician

## 2020-01-17 ENCOUNTER — Encounter: Payer: 59 | Attending: Surgery | Admitting: Dietician

## 2020-01-17 ENCOUNTER — Other Ambulatory Visit: Payer: Self-pay

## 2020-01-17 DIAGNOSIS — E669 Obesity, unspecified: Secondary | ICD-10-CM | POA: Insufficient documentation

## 2020-01-17 NOTE — Progress Notes (Signed)
Supervised Weight Loss Visit Bariatric Nutrition Education  Planned Surgery: Sleeve  Pt Expectation of Surgery/ Goals: to lose weight, to have better health  1st out of 6 SWL Appointments    NUTRITION ASSESSMENT  Anthropometrics  Start weight at [facility]: 313.5 lbs (date: 12/09/2019) Today's weight: 305.7 lbs BMI: 47.9 kg/m2    Lifestyle & Dietary Hx Does not eat much throughout the day, or eat often. Patient states she has been "starving" herself since she gets caught up during the day and often forgets to eat, which she states she knows is not good. Would like to do better with grocery shopping and drinking more water.   24-Hr Dietary Recall First Meal: - Snack: - Second Meal: chicken + rice + vegetables Snack: - Third Meal: [employer]  Snack: - Beverages: water, juice   NUTRITION DIAGNOSIS  Overweight/obesity ([place]-3.3) related to past poor dietary habits and physical inactivity as evidenced by patient w/ planned Sleeve Gastrectomy surgery following dietary guidelines for continued weight loss.   NUTRITION INTERVENTION  Nutrition counseling (C-1) and education (E-2) to facilitate bariatric surgery goals.  Pre-Op Goals Progress & New Goals . NEW: Eat small frequent meals/snacks throughout the day . NEW: Avoid drinking before/during/after meals/snacks  Learning Style & Readiness for Change Teaching method utilized: Visual & Auditory  Demonstrated degree of understanding via: Teach Back  Barriers to learning/adherence to lifestyle change: None Identified    MONITORING & EVALUATION Dietary intake, weekly physical activity, body weight, and pre-op goals in 1 month.   Next Steps  Patient is to return to [facility] in 1 month for 2nd (out of 6) SWL visit.

## 2020-01-17 NOTE — Patient Instructions (Signed)
Set alarms to remind you to eat every 2-3 hours. Remember to have small, frequent meals/snacks throughout the day.   Focus on increasing water throughout the day in between meals/snacks. Remember to not drink 15 minutes before, during, and 30 minutes after meals/snacks.

## 2020-02-12 ENCOUNTER — Ambulatory Visit: Payer: 59 | Admitting: Dietician

## 2020-02-18 ENCOUNTER — Other Ambulatory Visit: Payer: Self-pay

## 2020-02-18 ENCOUNTER — Encounter: Payer: 59 | Attending: Surgery | Admitting: Skilled Nursing Facility1

## 2020-02-18 DIAGNOSIS — E669 Obesity, unspecified: Secondary | ICD-10-CM | POA: Diagnosis present

## 2020-02-18 NOTE — Progress Notes (Signed)
Supervised Weight Loss Visit Bariatric Nutrition Education  Planned Surgery: Sleeve  Pt Expectation of Surgery/ Goals: to lose weight, to have better health  2nd out of 6 SWL Appointments    NUTRITION ASSESSMENT  Anthropometrics  Start weight at [facility]: 313.5 lbs (date: 12/09/2019) Today's weight: 311 lbs BMI: 48.80 kg/m2    Lifestyle & Dietary Hx  Pt arrives having gained about 6 pounds.  Pt states she is starting to get down how to not drink with meals and chew well. Pt states she eats more for conveinance eating a lot of waffle house because It is so close so she has been trying to buy more TV dinners. Pt states she has really been focusing on drinking more water but lately has fallen off of that. 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   24-Hr Dietary Recall First Meal: -cereal  Snack: - Second Meal: chicken + rice + vegetables Snack: - Third Meal: McDonald's  Snack: - Beverages: water, juice   NUTRITION DIAGNOSIS  Overweight/obesity ([place]-3.3) related to past poor dietary habits and physical inactivity as evidenced by patient w/ planned Sleeve Gastrectomy surgery following dietary guidelines for continued weight loss.   NUTRITION INTERVENTION  Nutrition counseling (C-1) and education (E-2) to facilitate bariatric surgery goals.  Pre-Op Goals Progress & New Goals . Continue Eat small frequent meals/snacks throughout the day . Continue Avoid drinking before/during/after meals/snacks . NEW: Eat breakfast every morning; wake at 7:30am  . NEW: set an alarm to get up every hour at work  Geophysical data processor & Readiness for Change Teaching method utilized: Patent attorney & Auditory  Demonstrated degree of understanding via: Teach Back  Barriers to learning/adherence to lifestyle change: None Identified    MONITORING & EVALUATION Dietary intake, weekly physical activity, body weight, and  pre-op goals in 1 month.   Next Steps  Patient is to return to [facility] in 1 month for 3rd (out of 6) SWL visit.

## 2020-05-21 ENCOUNTER — Ambulatory Visit: Payer: 59 | Admitting: Skilled Nursing Facility1

## 2020-08-29 ENCOUNTER — Encounter (HOSPITAL_BASED_OUTPATIENT_CLINIC_OR_DEPARTMENT_OTHER): Payer: Self-pay | Admitting: Emergency Medicine

## 2020-08-29 ENCOUNTER — Emergency Department (HOSPITAL_BASED_OUTPATIENT_CLINIC_OR_DEPARTMENT_OTHER): Payer: 59

## 2020-08-29 ENCOUNTER — Other Ambulatory Visit: Payer: Self-pay

## 2020-08-29 ENCOUNTER — Emergency Department (HOSPITAL_BASED_OUTPATIENT_CLINIC_OR_DEPARTMENT_OTHER)
Admission: EM | Admit: 2020-08-29 | Discharge: 2020-08-29 | Disposition: A | Discharge status: Home / Self Care | Payer: 59 | Attending: Emergency Medicine | Admitting: Emergency Medicine

## 2020-08-29 DIAGNOSIS — M791 Myalgia, unspecified site: Secondary | ICD-10-CM | POA: Insufficient documentation

## 2020-08-29 DIAGNOSIS — Y9389 Activity, other specified: Secondary | ICD-10-CM | POA: Insufficient documentation

## 2020-08-29 DIAGNOSIS — Z7984 Long term (current) use of oral hypoglycemic drugs: Secondary | ICD-10-CM | POA: Insufficient documentation

## 2020-08-29 DIAGNOSIS — Y999 Unspecified external cause status: Secondary | ICD-10-CM | POA: Diagnosis not present

## 2020-08-29 DIAGNOSIS — Z794 Long term (current) use of insulin: Secondary | ICD-10-CM | POA: Insufficient documentation

## 2020-08-29 DIAGNOSIS — M542 Cervicalgia: Secondary | ICD-10-CM | POA: Diagnosis not present

## 2020-08-29 DIAGNOSIS — Y92410 Unspecified street and highway as the place of occurrence of the external cause: Secondary | ICD-10-CM | POA: Diagnosis not present

## 2020-08-29 DIAGNOSIS — I10 Essential (primary) hypertension: Secondary | ICD-10-CM | POA: Insufficient documentation

## 2020-08-29 DIAGNOSIS — M79604 Pain in right leg: Secondary | ICD-10-CM | POA: Insufficient documentation

## 2020-08-29 MED ORDER — METHOCARBAMOL 500 MG PO TABS: 500.0000 mg | ORAL_TABLET | Freq: Two times a day (BID) | ORAL | 0 refills | Status: DC

## 2020-08-29 MED ORDER — OXYCODONE-ACETAMINOPHEN 5-325 MG PO TABS
1.0000 | ORAL_TABLET | Freq: Once | ORAL | Status: AC
  Administered 2020-08-29: 1 via ORAL
  Filled 2020-08-29: qty 1
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
## 2020-08-29 NOTE — Discharge Instructions (Signed)

## 2020-12-16 ENCOUNTER — Other Ambulatory Visit: Payer: Self-pay | Admitting: Surgery

## 2020-12-16 ENCOUNTER — Other Ambulatory Visit (HOSPITAL_COMMUNITY): Payer: Self-pay | Admitting: Surgery

## 2020-12-25 ENCOUNTER — Encounter (HOSPITAL_COMMUNITY): Payer: Self-pay

## 2020-12-25 ENCOUNTER — Ambulatory Visit (HOSPITAL_COMMUNITY): Admission: RE | Admit: 2020-12-25 | Payer: 59 | Source: Ambulatory Visit

## 2021-02-08 ENCOUNTER — Encounter: Payer: 59 | Attending: Surgery | Admitting: Skilled Nursing Facility1

## 2021-02-08 ENCOUNTER — Ambulatory Visit (HOSPITAL_COMMUNITY)
Admission: RE | Admit: 2021-02-08 | Discharge: 2021-02-08 | Disposition: A | Discharge status: Home / Self Care | Payer: 59 | Source: Ambulatory Visit | Attending: Surgery | Admitting: Surgery

## 2021-02-08 ENCOUNTER — Other Ambulatory Visit (HOSPITAL_COMMUNITY): Payer: Self-pay | Admitting: Surgery

## 2021-02-08 ENCOUNTER — Other Ambulatory Visit: Payer: Self-pay

## 2021-02-08 ENCOUNTER — Encounter: Payer: Self-pay | Admitting: Skilled Nursing Facility1

## 2021-02-08 ENCOUNTER — Ambulatory Visit (HOSPITAL_COMMUNITY): Admission: RE | Admit: 2021-02-08 | Payer: 59 | Source: Ambulatory Visit

## 2021-02-08 DIAGNOSIS — E669 Obesity, unspecified: Secondary | ICD-10-CM | POA: Insufficient documentation

## 2021-02-08 NOTE — Progress Notes (Signed)
Nutrition Assessment for Bariatric Surgery Medical Nutrition Therapy Appt Start Time: 11:20am    End Time: 12:15pm  Patient was seen on 02/08/2021 for Pre-Operative Nutrition Assessment. Letter of approval faxed to [facility] bariatric surgery program coordinator on 02/08/2021.   Referral stated Supervised Weight Loss (SWL) visits needed: 0  Pt completed visits.  Pt has cleared nutrition requirements.  Planned surgery: sleeve Pt expectation of surgery: lose 100 lb, feel better, have more energy Pt expectation of dietitian: none identified    NUTRITION ASSESSMENT   Anthropometrics  Start weight at [facility]: 320.5 lbs (date: 02/08/2021)  Height: 67 in BMI: 50.20 kg/m2     Clinical  Medical hx: pulmonary embolism, anemia, gestational diabetes, prediabetes Medications:  One a day multivitamin, vitamin d supplement Labs: HbA1c 5.7 (10/09/2019), vitamin D 22 NG/mL (03/06/2018) Notable signs/symptoms: none Any previous deficiencies? vitamin D and iron  Micronutrient Nutrition Focused Physical Exam: Hair: No issues observed Eyes: No issues observed Mouth: No issues observed Neck: No issues observed Nails: No issues observed Skin: No issues observed  Lifestyle & Dietary Hx Reports doing ok, trying to take time for herself, still working a lot.  Pt has reduced the frequency of skipping meals, from 7/week to 3/week and has been trying to make healthier choices with her food.  Pt reports not being a big sweet eater, has pie occasionally.   24-Hr Dietary Recall First Meal: sausage or bacon and eggs, grits, fruit (grapes and watermelon) Snack: pack of nabs  Second Meal: noodle bowl Snack: popcorn Third Meal: chicken stir fry homemade with rice Snack: none Beverages: water, tropicana mango pineapple juice (regular), lemonade + tea   Estimated Energy Needs Calories: 1500   NUTRITION DIAGNOSIS  Overweight/obesity ([place]-3.3) related to past poor dietary habits and  physical inactivity as evidenced by patient w/ planned sleeve surgery following dietary guidelines for continued weight loss.    NUTRITION INTERVENTION  Nutrition counseling (C-1) and education (E-2) to facilitate bariatric surgery goals.   Pre-Op Goals Reviewed with the Patient . Track food and beverage intake (pen and paper, MyFitness Pal, Baritastic app, etc.) . Make healthy food choices while monitoring portion sizes . Consume 3 meals per day or try to eat every 3-5 hours . Avoid concentrated sugars and fried foods . Keep sugar & fat in the single digits per serving on food labels . Practice CHEWING your food (aim for applesauce consistency) . Practice not drinking 15 minutes before, during, and 30 minutes after each meal and snack . Avoid all carbonated beverages (ex: soda, sparkling beverages)  . Limit caffeinated beverages (ex: coffee, tea, energy drinks) . Avoid all sugar-sweetened beverages (ex: regular soda, sports drinks)  . Avoid alcohol  . Aim for 64-100 ounces of FLUID daily (with at least half of fluid intake being plain water)  . Aim for at least 60-80 grams of PROTEIN daily . Look for a liquid protein source that contains ?15 g protein and ?5 g carbohydrate (ex: shakes, drinks, shots) . Make a list of non-food related activities . Physical activity is an important part of a healthy lifestyle so keep it moving! The goal is to reach 150 minutes of exercise per week, including cardiovascular and weight baring activity.  *Goals that are bolded indicate the pt would like to start working towards these  Handouts Provided Include  . Bariatric Surgery handouts (Nutrition Visits, Pre-Op Goals, Protein Shakes, Vitamins & Minerals)  Learning Style & Readiness for Change Teaching method utilized: Visual & Auditory  Demonstrated  degree of understanding via: Teach Back  Readiness Level: action Barriers to learning/adherence to lifestyle change: none identified  RD's Notes for  Next Visit . Sent email to attend support group this Thursday (02/11/2021)     MONITORING & EVALUATION Dietary intake, weekly physical activity, body weight, and pre-op goals reached at next nutrition visit.    Next Steps  Patient is to follow up at [facility] for Pre-Op Class >2 weeks before surgery for further nutrition education.  Pt has completed visits. No further supervised visits required/recomended

## 2021-03-11 ENCOUNTER — Encounter: Payer: 59 | Attending: Surgery | Admitting: Skilled Nursing Facility1

## 2021-03-11 DIAGNOSIS — E669 Obesity, unspecified: Secondary | ICD-10-CM | POA: Insufficient documentation

## 2021-03-24 ENCOUNTER — Ambulatory Visit: Payer: 59 | Admitting: Skilled Nursing Facility1

## 2021-04-08 ENCOUNTER — Encounter: Payer: 59 | Admitting: Skilled Nursing Facility1

## 2021-04-22 ENCOUNTER — Encounter: Payer: 59 | Attending: Surgery | Admitting: Skilled Nursing Facility1

## 2021-04-22 ENCOUNTER — Other Ambulatory Visit: Payer: Self-pay

## 2021-04-22 DIAGNOSIS — E669 Obesity, unspecified: Secondary | ICD-10-CM

## 2021-04-22 NOTE — Progress Notes (Signed)
Supervised Weight Loss Visit Bariatric Nutrition Education Sleeve   3 out of 10 visits   NUTRITION ASSESSMENT  Anthropometrics  Start weight at [facility]: 320.5 lbs (date: 02/08/2021)  Weight: 318 pounds BMI: 49.81 kg/m2     Clinical  Medical hx: pulmonary embolism, anemia, gestational diabetes, prediabetes Medications:  One a day multivitamin, vitamin d supplement Labs: HbA1c 5.7 (10/09/2019), vitamin D 22 NG/mL (03/06/2018) Notable signs/symptoms: none Any previous deficiencies? vitamin D and iron  Lifestyle & Dietary Hx  Pt state she does feel lazy with getting to her changes.   Estimated daily fluid intake:  oz Supplements:  Current average weekly physical activity: walking around complex 2 times a week for 60 minutes-meandering  24-Hr Dietary Recall: eating out more than she cooks First Meal: eggs + cheese  + onion + grits Snack: eggs or nabs Second Meal: baked chicken + salad or chicken wrap + spinach + onion Snack:  Third Meal: shrimp and chicken stir fry + rice Snack:  Beverages: gatorade, water, applejuice, tropicana  Estimated Energy Needs Calories: 1500   NUTRITION DIAGNOSIS  Overweight/obesity ([place]-3.3) related to past poor dietary habits and physical inactivity as evidenced by patient w/ planned sleeve surgery following dietary guidelines for continued weight loss.   NUTRITION INTERVENTION  Nutrition counseling (C-1) and education (E-2) to facilitate bariatric surgery goals.  Pre-Op Goals Progress & New Goals [facility]: [facility] NEW: limit eating out to 2 days a week  Handouts Provided Include    Learning Style & Readiness for Change Teaching method utilized: Visual & Auditory  Demonstrated degree of understanding via: Teach Back  Readiness Level: contemplative Barriers to learning/adherence to lifestyle change: unknown  RD's Notes for next Visit  Assess pts adherence to chosen goals   MONITORING & EVALUATION Dietary intake, weekly  physical activity, body weight, and pre-op goals in 1 month.   Next Steps  [facility] is to return to [facility] for next appt

## 2021-05-05 ENCOUNTER — Ambulatory Visit: Payer: 59 | Admitting: Skilled Nursing Facility1

## 2021-05-18 ENCOUNTER — Other Ambulatory Visit: Payer: Self-pay

## 2021-05-18 ENCOUNTER — Encounter: Payer: 59 | Attending: Surgery | Admitting: Skilled Nursing Facility1

## 2021-05-18 DIAGNOSIS — E669 Obesity, unspecified: Secondary | ICD-10-CM | POA: Diagnosis not present

## 2021-05-18 NOTE — Progress Notes (Addendum)
Supervised Weight Loss Visit Bariatric Nutrition Education Sleeve   3 out of 10 visits   NUTRITION ASSESSMENT  Anthropometrics  Start weight at [facility]: 320.5 lbs (date: 02/08/2021)  Weight: 318.8 pounds BMI: 49.93 kg/m2     Clinical  Medical hx: pulmonary embolism, anemia, gestational diabetes, prediabetes Medications:  One a day multivitamin, vitamin d supplement Labs: HbA1c 5.7 (10/09/2019), vitamin D 22 NG/mL (03/06/2018) Notable signs/symptoms: none Any previous deficiencies? vitamin D and iron  Lifestyle & Dietary Hx  Pt states she does feel lazy with getting to her changes.   Pt states [facility]. Pt states she is trying to cook more meals at home but has not hit goal yet. Pt states she has been procrastinating with making her changes.    Estimated daily fluid intake:  oz Supplements:  Current average weekly physical activity: [employer] 2 days a week walking around the track 3 times  24-Hr Dietary Recall: eating out more than she cooks First Meal: 3 eggs + cheese  + onion + grits + spinach Snack: nabs Second Meal: baked chicken + rice + peas Snack: protein shake Third Meal: shrimp and chicken stir fry + rice + veggies Snack:  Beverages: gatorade, water, applejuice, tropicana  Estimated Energy Needs Calories: 1500   NUTRITION DIAGNOSIS  Overweight/obesity ([place]-3.3) related to past poor dietary habits and physical inactivity as evidenced by patient w/ planned sleeve surgery following dietary guidelines for continued weight loss.   NUTRITION INTERVENTION  Nutrition counseling (C-1) and education (E-2) to facilitate bariatric surgery goals.  Pre-Op Goals Progress & New Goals Continue/NEW: get to [facility] 3 times a [facility] Continue/NEW: limit eating out to 2 days a week NEW: continue to aim to increase water 64 ounces per day NEW: grocery shop once week NEW: journal your food and drink  Handouts Provided Include   Meal Ideas sheet  Learning Style & Readiness for Change Teaching method utilized: Visual & Auditory  Demonstrated degree of understanding via: Teach Back  Readiness Level: contemplative Barriers to learning/adherence to lifestyle change: unknown  RD's Notes for next Visit  Assess pts adherence to chosen goals   MONITORING & EVALUATION Dietary intake, weekly physical activity, body weight, and pre-op goals in 1 month.   Next Steps  Patient is to return to [facility] for next appt

## 2021-06-10 ENCOUNTER — Other Ambulatory Visit: Payer: Self-pay

## 2021-06-10 ENCOUNTER — Encounter: Payer: 59 | Attending: Surgery | Admitting: Skilled Nursing Facility1

## 2021-06-10 DIAGNOSIS — E669 Obesity, unspecified: Secondary | ICD-10-CM | POA: Insufficient documentation

## 2021-06-10 NOTE — Progress Notes (Addendum)
Supervised Weight Loss Visit Bariatric Nutrition Education Sleeve   4 out of 10 visits   NUTRITION ASSESSMENT  Anthropometrics  [facility] weight at [facility]: 320.5 lbs (date: 02/08/2021)  Weight: 320.3 pounds BMI: 50.17 kg/m2     Clinical  Medical hx: pulmonary embolism, anemia, gestational diabetes, prediabetes Medications:  One a day multivitamin, vitamin d supplement Labs: HbA1c 5.7 (10/09/2019), vitamin D 22 NG/mL (03/06/2018) Notable signs/symptoms: none Any previous deficiencies? vitamin D and iron  Lifestyle & Dietary Hx   Pt states work drama has been happening which has been annoying. Pt states logging her foods has been helpful for her to see what she is actually putting into her body.  Pt states she hs been really making changes this time trying to take it more seriously.   Pt has done well with making changes lately so will continue with those and not add anything new at this time.    Estimated daily fluid intake:  oz Supplements:  Current average weekly physical activity: [employer] 2 days a week walking around the track 3 times  24-Hr Dietary Recall: eating out more than she cooks First Meal: grits + wheat toast or omelet + spinach + onions  Snack: nabs Second Meal: chicken + broccoli + onion + rice or chicken wrap + spinach + onions Snack: protein shake or trail mix Third Meal: pot roast + carrots + potatoes or noodles  + smoked sausage  Snack:  Beverages: gatorade, water, applejuice, tropicana, coffee + flavored creamer  Estimated Energy Needs Calories: 1500   NUTRITION DIAGNOSIS  Overweight/obesity ([place]-3.3) related to past poor dietary habits and physical inactivity as evidenced by patient w/ planned sleeve surgery following dietary guidelines for continued weight loss.   NUTRITION INTERVENTION  Nutrition counseling (C-1) and education (E-2) to facilitate bariatric surgery goals.  Pre-Op Goals Progress & New Goals Continue/NEW: get to [facility] 3 times a  week Continue/NEW: limit eating out to 2 days a week continue: continue to aim to increase water 64 ounces per day Continue: grocery shop once week continue: journal your food and drink  Handouts Previously Provided Include  Meal Ideas sheet  Learning Style & Readiness for Change Teaching method utilized: Visual & Auditory  Demonstrated degree of understanding via: Teach Back  Readiness Level: contemplative Barriers to learning/adherence to lifestyle change: unknown  RD's Notes for next Visit  Assess pts adherence to chosen goals   MONITORING & EVALUATION Dietary intake, weekly physical activity, body weight, and pre-op goals   Next Steps  [facility] is to return to [facility] for next appt

## 2021-06-24 ENCOUNTER — Ambulatory Visit: Payer: 59 | Admitting: Skilled Nursing Facility1

## 2021-07-06 ENCOUNTER — Other Ambulatory Visit: Payer: Self-pay

## 2021-07-06 ENCOUNTER — Encounter: Payer: 59 | Attending: Surgery | Admitting: Skilled Nursing Facility1

## 2021-07-06 DIAGNOSIS — E669 Obesity, unspecified: Secondary | ICD-10-CM | POA: Diagnosis not present

## 2021-07-06 NOTE — Progress Notes (Addendum)
Supervised Weight Loss Visit Bariatric Nutrition Education Sleeve   5 out of 10 visits   NUTRITION ASSESSMENT  Anthropometrics  [facility] weight at [facility]: 320.5 lbs (date: 02/08/2021)  Weight: 315.3 pounds BMI: 49.38 kg/m2     Clinical  Medical hx: pulmonary embolism, anemia, gestational diabetes, prediabetes Medications:  One a day multivitamin, vitamin d supplement Labs: HbA1c 5.7 (10/09/2019), vitamin D 22 NG/mL (03/06/2018) Notable signs/symptoms: none Any previous deficiencies? vitamin D and iron  Lifestyle & Dietary Hx   Pt states [facility]. Pt state she does journal her foods which she finds helpful. Pt states she has been cooking more at home stating she ahs been saving money and is realizing it is healthy and having leftovers for later: cooking 3 days a week and having leftovers other days.  Pt states eh has been focusing on only water with meals.   Estimated daily fluid intake:  oz Supplements:  Current average weekly physical activity: [employer] 2 days a week walking around the track 3 times  24-Hr Dietary Recall: eating out more than she cooks First Meal: grits + wheat toast or omelet + spinach + onions or kind bar or 2 eggs + cheese + grits + bacon + toast Snack: nabs Second Meal: 2 salmon patties + rice + biscuit Snack: protein shake or trail mix Third Meal: steak and shrimp hibachi  Snack:  Beverages: gatorade, water, applejuice, tropicana, coffee + flavored creamer  Estimated Energy Needs Calories: 1500   NUTRITION DIAGNOSIS  Overweight/obesity ([place]-3.3) related to past poor dietary habits and physical inactivity as evidenced by patient w/ planned sleeve surgery following dietary guidelines for continued weight loss.   NUTRITION INTERVENTION  Nutrition counseling (C-1) and education (E-2) to facilitate bariatric surgery goals.  Pre-Op Goals Progress & New Goals Continue: get to [facility] 3 times a [facility] Continue: limit  eating out to 2 days a week continue: continue to aim to increase water 64 ounces per day Continue: grocery shop once week continue: journal your food and drink NEW: limit sugary drinks including flavored creamer (switching to sugar free) NEW: work on not drinking with meals  Handouts Previously Provided Include  Meal Ideas sheet  Learning Style & Readiness for Change Teaching method utilized: Visual & Auditory  Demonstrated degree of understanding via: Teach Back  Readiness Level: contemplative Barriers to learning/adherence to lifestyle change: unknown  RD's Notes for next Visit  Assess pts adherence to chosen goals   MONITORING & EVALUATION Dietary intake, weekly physical activity, body weight, and pre-op goals   Next Steps  Patient is to return to [facility] for next appt

## 2021-07-08 ENCOUNTER — Ambulatory Visit: Payer: 59 | Admitting: Skilled Nursing Facility1

## 2021-07-09 ENCOUNTER — Ambulatory Visit (INDEPENDENT_AMBULATORY_CARE_PROVIDER_SITE_OTHER): Payer: 59 | Admitting: Psychology

## 2021-07-30 ENCOUNTER — Ambulatory Visit (INDEPENDENT_AMBULATORY_CARE_PROVIDER_SITE_OTHER): Payer: 59 | Admitting: Psychology

## 2021-08-09 ENCOUNTER — Encounter: Payer: 59 | Attending: Surgery | Admitting: Skilled Nursing Facility1

## 2021-08-09 ENCOUNTER — Other Ambulatory Visit: Payer: Self-pay

## 2021-08-09 ENCOUNTER — Encounter: Payer: Self-pay | Admitting: Skilled Nursing Facility1

## 2021-08-09 DIAGNOSIS — E669 Obesity, unspecified: Secondary | ICD-10-CM | POA: Diagnosis not present

## 2021-08-09 NOTE — Progress Notes (Addendum)
Supervised Weight Loss Visit Bariatric Nutrition Education Sleeve   6 out of 10 visits   NUTRITION ASSESSMENT  Anthropometrics  [facility] weight at [facility]: 320.5 lbs (date: 02/08/2021)  Weight: 313.8 pounds BMI: 49.15 kg/m2     Clinical  Medical hx: pulmonary embolism, anemia, gestational diabetes, prediabetes Medications:  One a day multivitamin, vitamin d supplement Labs: HbA1c 5.7 (10/09/2019), vitamin D 22 NG/mL (03/06/2018) Notable signs/symptoms: none Any previous deficiencies? vitamin D and iron  Lifestyle & Dietary Hx  Pt states she started lexapro which she does to care for the side affects inclusive of headaches. Pt states she is going to get in touch with a psychiatrist to work on her mental health medications with minimal side effects.  Pt states she was able to reduce how much juice she drinks and having been mindful about drinking water after every meal.  Pt states she has been working on moving but struggling to get her own place so [place] with one of her Childrens father.   Estimated daily fluid intake:  oz Supplements:  Current average weekly physical activity: [employer] 2 days a week walking around the track 3 times  24-Hr Dietary Recall: eating out more than she cooks First Meal: grits + wheat toast or omelet + spinach + onions + cheese or kind bar or 2 eggs + cheese + grits + bacon + toast Snack: nabs Second Meal: chicken sud Snack: protein shake or trail mix Third Meal: baked pork chop + gravy + brown rice + gravy + cabbage  Snack:  Beverages: gatorade, water, applejuice, tropicana  Estimated Energy Needs Calories: 1500   NUTRITION DIAGNOSIS  Overweight/obesity ([place]-3.3) related to past poor dietary habits and physical inactivity as evidenced by patient w/ planned sleeve surgery following dietary guidelines for continued weight loss.   NUTRITION INTERVENTION  Nutrition counseling (C-1) and education (E-2) to facilitate bariatric surgery goals.  Pre-Op Goals  Progress & New Goals Continue: [facility] a [facility] Continue: limit eating out to 2 days a week continue: continue to aim to increase water 64 ounces per day Continue: grocery shop once week continue: journal your food and drink continue: limit sugary drinks including flavored creamer (switching to sugar free) continue: work on not drinking with meals NEW: walking in the neighborhood  NEW: limit saturated fat consumption   Handouts Previously Provided Include  Meal Ideas sheet  Learning Style & Readiness for Change Teaching method utilized: Visual & Auditory  Demonstrated degree of understanding via: Teach Back  Readiness Level: contemplative Barriers to learning/adherence to lifestyle change: unknown  RD's Notes for next Visit  Assess pts adherence to chosen goals   MONITORING & EVALUATION Dietary intake, weekly physical activity, body weight, and pre-op goals   Next Steps  Patient is to return to [facility] for next appt

## 2021-09-13 ENCOUNTER — Encounter: Payer: 59 | Attending: Surgery | Admitting: Skilled Nursing Facility1

## 2021-09-13 ENCOUNTER — Other Ambulatory Visit: Payer: Self-pay

## 2021-09-13 DIAGNOSIS — E669 Obesity, unspecified: Secondary | ICD-10-CM | POA: Insufficient documentation

## 2021-09-13 NOTE — Progress Notes (Addendum)
Supervised Weight Loss Visit Bariatric Nutrition Education Sleeve   7 out of 10 visits   NUTRITION ASSESSMENT  Anthropometrics  [facility] weight at [facility]: 320.5 lbs (date: 02/08/2021)  Weight: 308 pounds BMI: 48.24 kg/m2     Clinical  Medical hx: pulmonary embolism, anemia, gestational diabetes, prediabetes Medications:  One a day multivitamin, vitamin d supplement Labs: HbA1c 5.7 (10/09/2019), vitamin D 22 NG/mL (03/06/2018) Notable signs/symptoms: none Any previous deficiencies? vitamin D and iron  Lifestyle & Dietary Hx  Pt states she has been working on moving but struggling to get her own place so staying with one of her Childrens father.   Pt states she recently started on zoloft. Pt state she has not really gotten to be consistent with activity due to housing being not settled yet.  Pt states she believes her weight loss to be due to focused on other things. Pt states she would like to log food more often. Pt states she is typically eating in moderation.   Estimated daily fluid intake:  oz Supplements:  Current average weekly physical activity: [facility]  24-Hr Dietary Recall: eating out more than she cooks First Meal: salmon and eggs Snack: nabs Second Meal: chicken salad  Snack: protein shake or trail mix Third Meal: chicken and rice + cream of mushroom Snack:  Beverages: gatorade, water, juice  Estimated Energy Needs Calories: 1500   NUTRITION DIAGNOSIS  Overweight/obesity ([place]-3.3) related to past poor dietary habits and physical inactivity as evidenced by patient w/ planned sleeve surgery following dietary guidelines for continued weight loss.   NUTRITION INTERVENTION  Nutrition counseling (C-1) and education (E-2) to facilitate bariatric surgery goals.  Pre-Op Goals Progress & New Goals Continue: [facility] a [facility] Continue: limit eating out to 2 days a week continue: continue to aim to increase water 64 ounces per day Continue: grocery shop once  week continue: journal your food and drink continue: limit sugary drinks including flavored creamer (switching to sugar free) continue: work on not drinking with meals continue: walking in the neighborhood  continue: limit saturated fat consumption   Handouts Previously Provided Include  Meal Ideas sheet  Learning Style & Readiness for Change Teaching method utilized: Visual & Auditory  Demonstrated degree of understanding via: Teach Back  Readiness Level: action Barriers to learning/adherence to lifestyle change: unknown  RD's Notes for next Visit  Assess pts adherence to chosen goals   MONITORING & EVALUATION Dietary intake, weekly physical activity, body weight, and pre-op goals   Next Steps  Patient is to return to [facility] for next appt

## 2021-09-21 ENCOUNTER — Encounter: Payer: 59 | Admitting: Skilled Nursing Facility1

## 2021-10-04 ENCOUNTER — Encounter: Payer: 59 | Attending: Surgery | Admitting: Skilled Nursing Facility1

## 2021-10-04 ENCOUNTER — Other Ambulatory Visit: Payer: Self-pay

## 2021-10-04 DIAGNOSIS — E669 Obesity, unspecified: Secondary | ICD-10-CM | POA: Insufficient documentation

## 2021-10-04 NOTE — Progress Notes (Signed)
Supervised Weight Loss Visit Bariatric Nutrition Education Sleeve   8 out of 10 visits   NUTRITION ASSESSMENT  Anthropometrics  Start weight at [facility]: 320.5 lbs (date: 02/08/2021)  Weight: 312 pounds BMI: 48.87 kg/m2     Clinical  Medical hx: pulmonary embolism, anemia, gestational diabetes, prediabetes Medications:  One a day multivitamin, vitamin d supplement, zoloft Labs: HbA1c 5.7 (10/09/2019), vitamin D 22 NG/mL (03/06/2018) Notable signs/symptoms: none Any previous deficiencies? vitamin D and iron  Lifestyle & Dietary Hx   Pt states her is settled into her new place so her activity has picked up due to being near by to a quiet neighborhood.  Pt states she will start with a psychiatrist for medication management.  Pt state she is doing well with not drinking with meals and chewing well and eating until satisfaction not to fullness. Pt states she does eat non starchy vegetables 2 times a day 7 days a week.  Pt states she realizes she needs to be more consistent and have more routines with activity wanting to get to the gym daily.   Estimated daily fluid intake:  oz Supplements:  Current average weekly physical activity: some walking  24-Hr Dietary Recall: eating out more than she cooks First Meal: salmon cakes and eggs Snack: nabs Second Meal: chicken salad or chef salad + 6 wings Snack: protein shake or trail mix or popcorn Third Meal: chicken and rice + cream of mushroom or macaroni pasta frozen meal Snack:  Beverages: gatorade, water, cut back on that juice  Estimated Energy Needs Calories: 1500   NUTRITION DIAGNOSIS  Overweight/obesity ([place]-3.3) related to past poor dietary habits and physical inactivity as evidenced by patient w/ planned sleeve surgery following dietary guidelines for continued weight loss.   NUTRITION INTERVENTION  Nutrition counseling (C-1) and education (E-2) to facilitate bariatric surgery goals.  Pre-Op Goals Progress & New  Goals Continue: [facility] a [facility] Continue: limit eating out to 2 days a week continue: continue to aim to increase water 64 ounces per day Continue: grocery shop once week continue: journal your food and drink continue: limit sugary drinks including flavored creamer (switching to sugar free) continue: work on not drinking with meals continue: walking in the neighborhood  continue: limit saturated fat consumption   Handouts Previously Provided Include  Meal Ideas sheet  Learning Style & Readiness for Change Teaching method utilized: Visual & Auditory  Demonstrated degree of understanding via: Teach Back  Readiness Level: action Barriers to learning/adherence to lifestyle change: unknown  RD's Notes for next Visit  Assess pts adherence to chosen goals   MONITORING & EVALUATION Dietary intake, weekly physical activity, body weight, and pre-op goals   Next Steps  [facility] is to return to [facility] for next appt

## 2021-10-14 ENCOUNTER — Encounter: Payer: 59 | Admitting: Skilled Nursing Facility1

## 2021-10-14 ENCOUNTER — Other Ambulatory Visit: Payer: Self-pay

## 2021-10-14 DIAGNOSIS — E669 Obesity, unspecified: Secondary | ICD-10-CM

## 2021-10-14 NOTE — Progress Notes (Signed)
Supervised Weight Loss Visit Bariatric Nutrition Education Sleeve   9 out of 10 visits   NUTRITION ASSESSMENT  Anthropometrics  Start weight at [facility]: 320.5 lbs (date: 02/08/2021)  Weight: 312.7 pounds BMI: 48.87 kg/m2     Clinical  Medical hx: pulmonary embolism, anemia, gestational diabetes, prediabetes Medications:  One a day multivitamin, vitamin d supplement, zoloft Labs: HbA1c 5.7 (10/09/2019), vitamin D 22 NG/mL (03/06/2018) Notable signs/symptoms: none Any previous deficiencies? vitamin D and iron  Lifestyle & Dietary Hx  Pt states she feels  lot better when only drinking water so not feeling as bloated.  Pt states she is still working on consistency with activity seeking a gym near her. Pt states she has been walking the neighborhood more.  Pt state she now eats more from home rather than eating out.  Pt states she is going to focus heavier on portion size and not drinking with meals.    Estimated daily fluid intake:  oz Supplements:  Current average weekly physical activity: some walking  24-Hr Dietary Recall:  First Meal: salmon cakes and eggs or jimmy dean breakfast bowls Snack: nabs Second Meal: chicken salad or chef salad + 6 wings or tuna salad + crackers Snack: protein shake or trail mix or popcorn Third Meal: chicken and rice + cream of mushroom or macaroni pasta frozen meal or chicken and shrimp with veggies + butter Snack:  Beverages: gatorade, water,  1 cup per week juice  Estimated Energy Needs Calories: 1500   NUTRITION DIAGNOSIS  Overweight/obesity ([place]-3.3) related to past poor dietary habits and physical inactivity as evidenced by patient w/ planned sleeve surgery following dietary guidelines for continued weight loss.   NUTRITION INTERVENTION  Nutrition counseling (C-1) and education (E-2) to facilitate bariatric surgery goals.  Pre-Op Goals Progress & New Goals Continue: [facility] a [facility] Continue: limit eating out to 2 days a  week continue: continue to aim to increase water 64 ounces per day Continue: grocery shop once week continue: journal your food and drink continue: limit sugary drinks including flavored creamer (switching to sugar free) continue: work on not drinking with meals continue: walking in the neighborhood  continue: limit saturated fat consumption  NEW: increase activity NEW: smaller portions   Handouts Previously Provided Include  Meal Ideas sheet  Learning Style & Readiness for Change Teaching method utilized: Visual & Auditory  Demonstrated degree of understanding via: Teach Back  Readiness Level: action Barriers to learning/adherence to lifestyle change: unknown  RD's Notes for next Visit  Assess pts adherence to chosen goals   MONITORING & EVALUATION Dietary intake, weekly physical activity, body weight, and pre-op goals   Next Steps  Patient is to return to [facility] for next appt

## 2021-10-20 ENCOUNTER — Other Ambulatory Visit: Payer: Self-pay

## 2021-10-20 ENCOUNTER — Encounter: Payer: 59 | Admitting: Skilled Nursing Facility1

## 2021-10-20 DIAGNOSIS — E669 Obesity, unspecified: Secondary | ICD-10-CM | POA: Diagnosis not present

## 2021-10-20 NOTE — Progress Notes (Signed)
Supervised Weight Loss Visit Bariatric Nutrition Education Sleeve   10 out of 10 visits  Pt completed visits.   Pt has cleared nutrition requirements.   NUTRITION ASSESSMENT  Anthropometrics  Start weight at [facility]: 320.5 lbs (date: 02/08/2021)  Weight: 310.4 pounds BMI: 48.87 kg/m2     Clinical  Medical hx: pulmonary embolism, anemia, gestational diabetes, prediabetes Medications:  One a day multivitamin, vitamin d supplement, zoloft Labs: HbA1c 5.7 (10/09/2019), vitamin D 22 NG/mL (03/06/2018) Notable signs/symptoms: none Any previous deficiencies? vitamin D and iron  Lifestyle & Dietary Hx  Pt states she has learned how much of this process is mental and how important being mindful of her portions and hunger level is necessary and chewing well without eating too fast is important, also the need to be physically active and cut out sugary beverages. Pt state she will take care of herself moving forward.   Pt states she will need to focus on maintaining her changes and staying in her journey and staying active and drinking enough water.    Estimated daily fluid intake:  oz Supplements:  Current average weekly physical activity: some walking but not much due to cold weather  24-Hr Dietary Recall:  First Meal: salmon cakes and eggs or jimmy dean breakfast bowls or pork chop in butter + grits Snack: nabs Second Meal: chicken salad or chef salad + 6 wings or tuna salad + crackers or frozen meal Snack: protein shake or trail mix or popcorn Third Meal: chicken and rice + cream of mushroom or macaroni pasta frozen meal or chicken and shrimp with veggies + butter or chicken + mashed potato + green beans Snack:  Beverages: gatorade, water,  1 cup per week juice  Estimated Energy Needs Calories: 1500   NUTRITION DIAGNOSIS  Overweight/obesity ([place]-3.3) related to past poor dietary habits and physical inactivity as evidenced by patient w/ planned sleeve surgery following dietary  guidelines for continued weight loss.   NUTRITION INTERVENTION  Nutrition counseling (C-1) and education (E-2) to facilitate bariatric surgery goals.  Pre-Op Goals Progress & New Goals [facility] Continue: limit eating out to 2 days a week continue: continue to aim to increase water 64 ounces per day Continue: grocery shop once week continue: journal your food and drink continue: limit sugary drinks including flavored creamer (switching to sugar free) continue: work on not drinking with meals continue: walking in the neighborhood  continue: limit saturated fat consumption  continue: increase activity continue: smaller portions   Handouts Previously Provided Include  Meal Ideas sheet  Learning Style & Readiness for Change Teaching method utilized: Visual & Auditory  Demonstrated degree of understanding via: Teach Back  Readiness Level: action Barriers to learning/adherence to lifestyle change: unknown  RD's Notes for next Visit  Assess pts adherence to chosen goals   MONITORING & EVALUATION Dietary intake, weekly physical activity, body weight, and pre-op goals   Next Steps  Patient is to return to [facility] for next appt  Pt has completed visits. No further supervised visits required/recomended

## 2021-12-05 IMAGING — RF DG UGI W SINGLE CM
12 of 15 series · 16 of 24 positions shown · non-contrast
Comparison: CT Abdomen and Pelvis 10/28/2018.

CLINICAL DATA: 33-year-old female with obesity. Preoperative
planning.

EXAM:
UPPER GI SERIES WITH KUB
TECHNIQUE: After obtaining a scout radiograph a routine upper GI series was
performed using barium.
FLUOROSCOPY TIME:  Fluoroscopy Time:  1 minutes 12 seconds
Radiation Exposure Index (if provided by the fluoroscopic device):
63.6 mGy
Number of Acquired Spot Images: 0

[Series 1: t abdomen supine · 0.15mm/px · 1 of 1 slices shown]
[im 1/1]
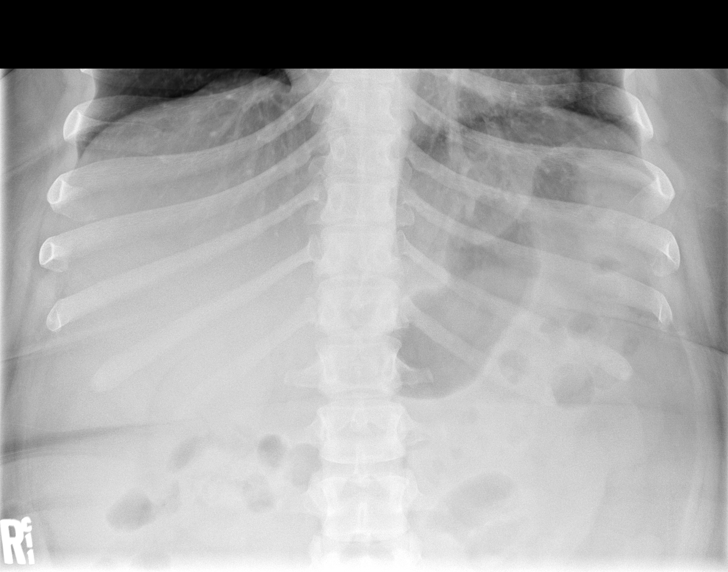

[Series 3: cp_standard · 0.51mm/px · 2 of 46 frames shown (1 of 11)]
[frame 7/46]
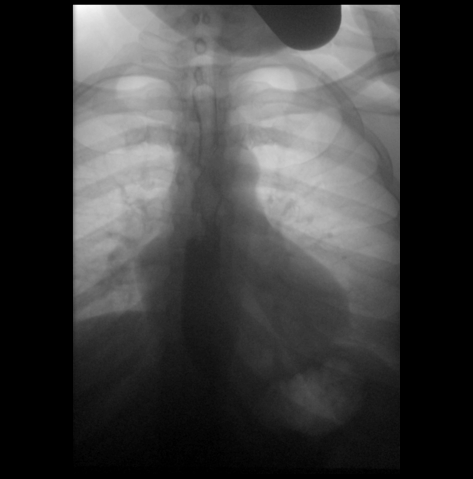
[frame 40/46]
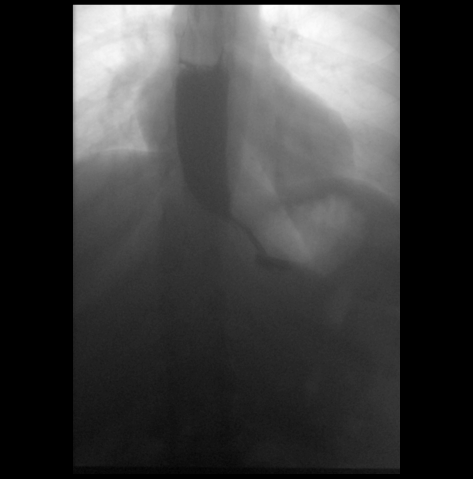

[Series 5: cp_standard · 0.51mm/px · 2 of 35 frames shown (2 of 11)]
[frame 18/35]
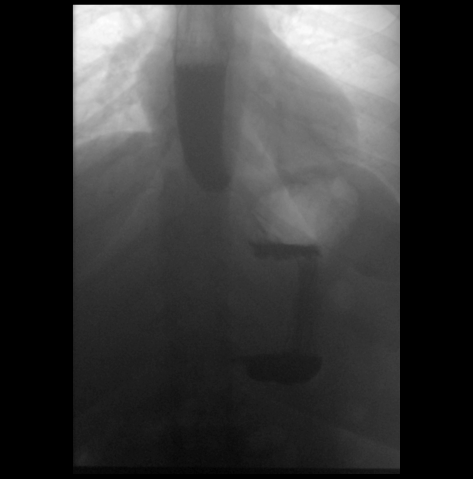
[frame 30/35]
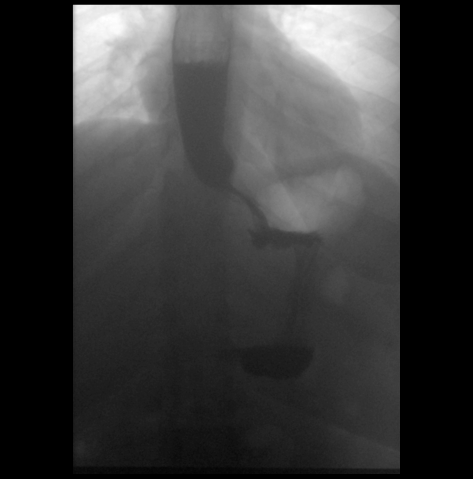

[Series 7: cp_standard · 0.51mm/px · 1 of 19 frames shown (3 of 11)]
[frame 17/19]
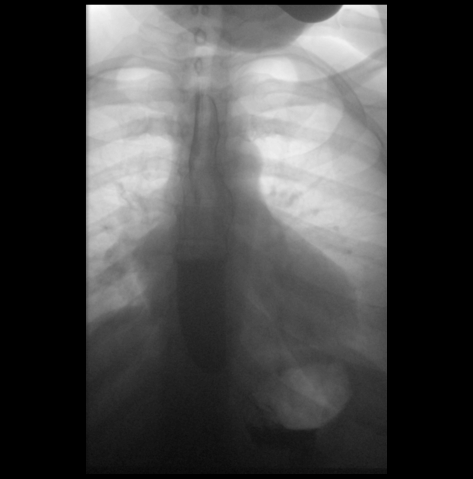

[Series 9: cp_standard · 0.26mm/px · 1 of 1 slices shown (4 of 11)]
[im 1/1]
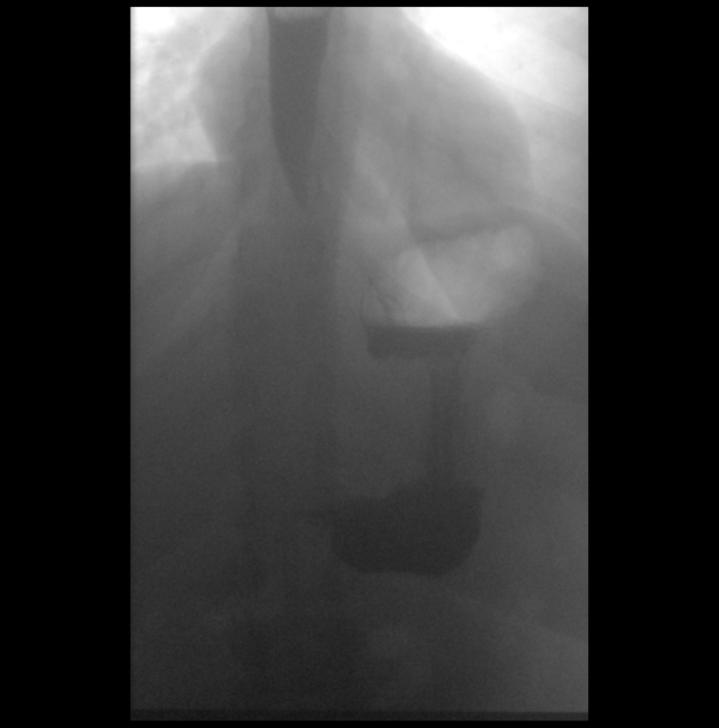

[Series 11: cp_standard · 0.51mm/px · 2 of 10 frames shown (5 of 11)]
[frame 6/10]
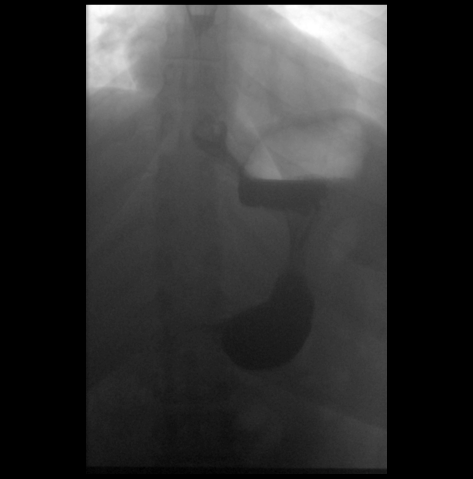
[frame 10/10]
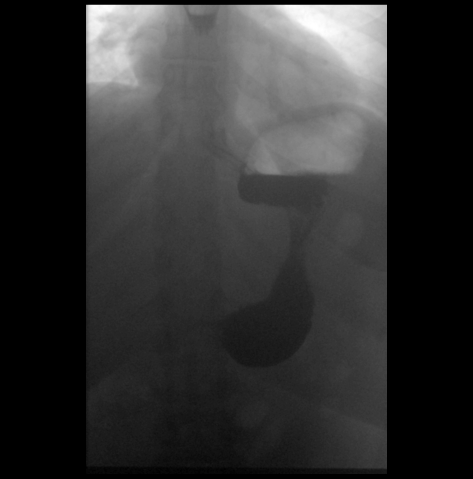

[Series 12: cp_standard · 0.51mm/px · 1 of 23 frames shown (6 of 11)]
[frame 12/23]
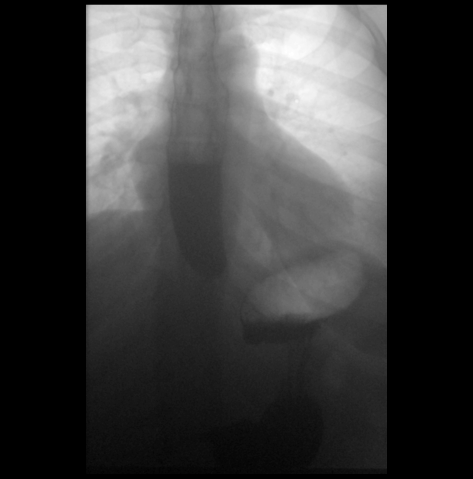

[Series 13: cp_standard · 0.26mm/px · 1 of 1 slices shown (7 of 11)]
[im 1/1]
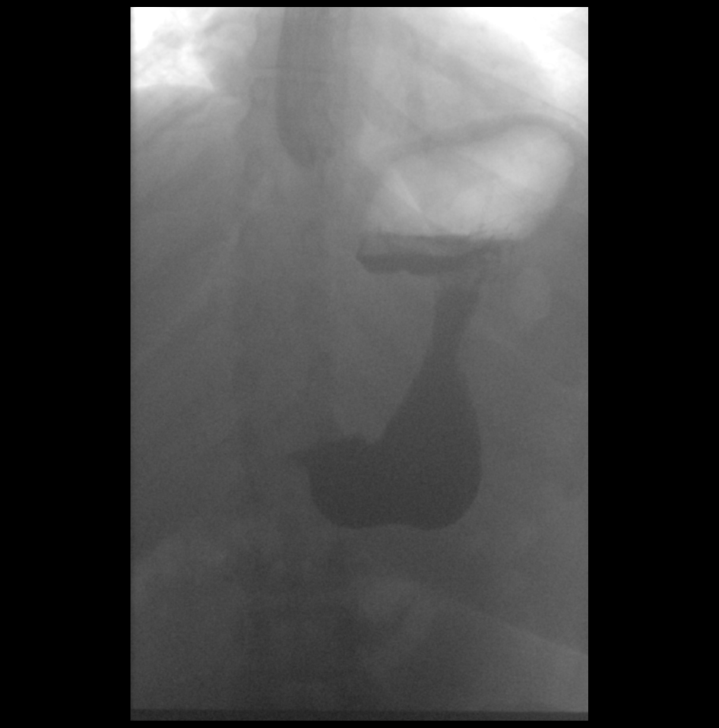

[Series 17: cp_standard · 0.26mm/px · 1 of 1 slices shown (8 of 11)]
[im 1/1]
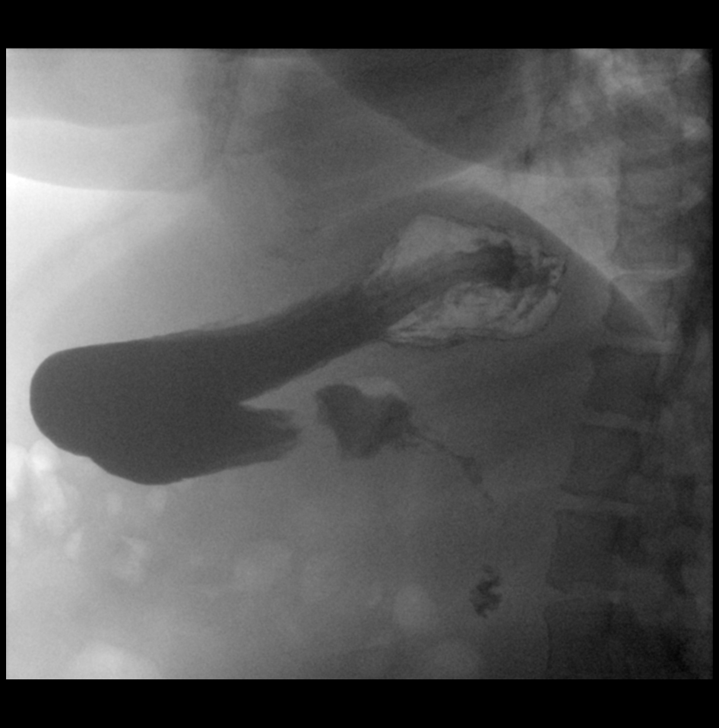

[Series 18: cp_standard · 0.26mm/px · 1 of 1 slices shown (9 of 11)]
[im 1/1]
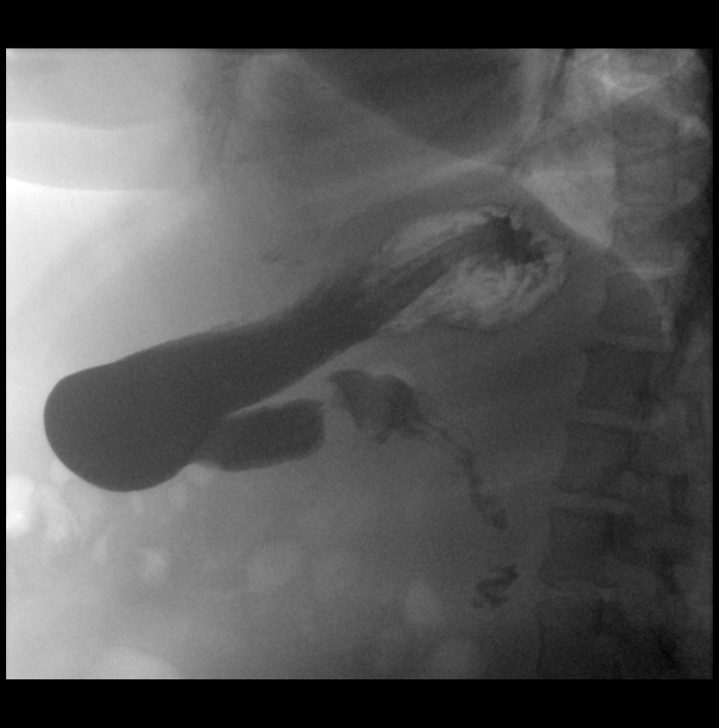

[Series 22: cp_standard · 0.51mm/px · 2 of 38 frames shown (10 of 11)]
[frame 1/38]
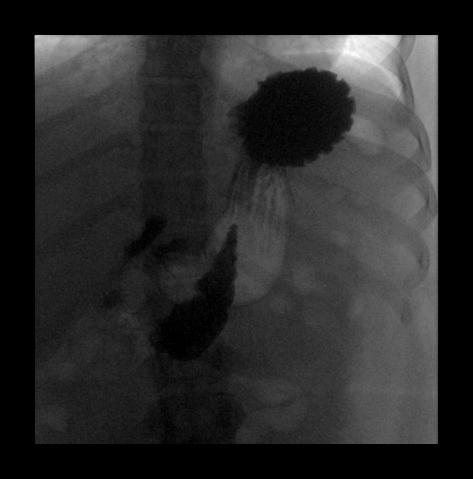
[frame 20/38]
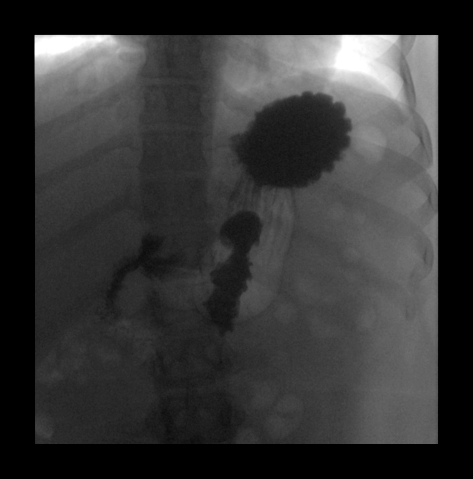

[Series 24: cp_standard · 0.25mm/px · 1 of 1 slices shown (11 of 11)]
[im 1/1]
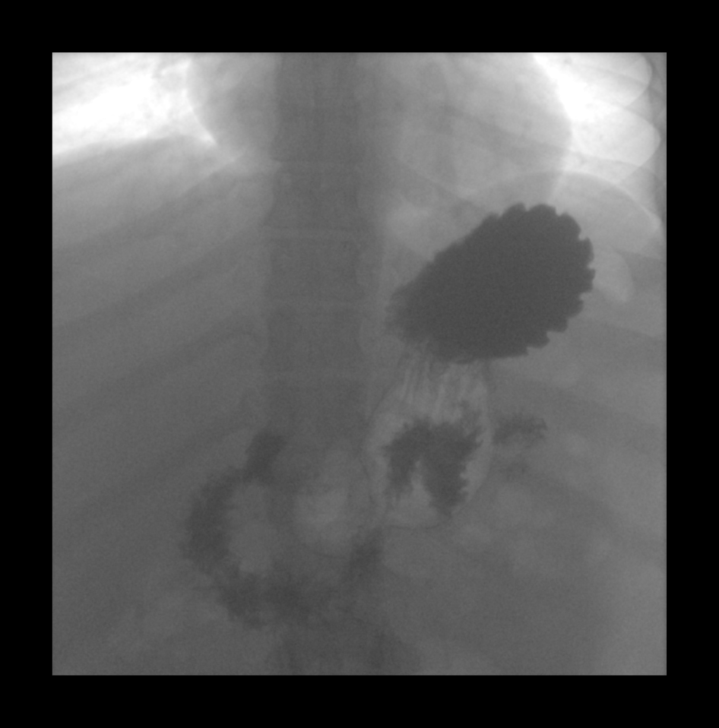

[16 of 24 positions shown; findings below may reference images not displayed]

FINDINGS: Preprocedural scalp views. Normal lung bases. Normal bowel gas
pattern. Normal abdominal and pelvic visceral contours. No acute
osseous abnormality identified.

No obstruction to the forward flow of contrast throughout the
esophagus and into the stomach. Normal esophageal course and
contour. Somewhat patulous esophagus. Mildly decreased esophageal
motility suspected.

Normal gastroesophageal junction. Normal gastric contour. Prompt
gastric emptying.

Normal duodenum C-loop configuration. Normal ligament of Treitz
(series 22). Duodenum and visible proximal small bowel loop mucosal
pattern within normal limits.

No gastroesophageal reflux occurred spontaneously or was elicited.
IMPRESSION: Normal preoperative upper GI aside from a somewhat patulous
esophagus with suggestion of mildly decreased esophageal motility.

## 2022-01-03 ENCOUNTER — Encounter: Payer: BC Managed Care – PPO | Attending: Surgery | Admitting: Skilled Nursing Facility1

## 2022-01-03 ENCOUNTER — Other Ambulatory Visit: Payer: Self-pay

## 2022-01-03 DIAGNOSIS — E669 Obesity, unspecified: Secondary | ICD-10-CM

## 2022-01-03 NOTE — Progress Notes (Signed)
Pre-Operative Nutrition Class:   ? ?Patient was seen on 01/03/2022 for Pre-Operative Bariatric Surgery Education at [facility].   ? ?Surgery date:  ?Surgery type: sleeve ?Start weight at [facility]: 320.5 ?Weight today: pt arrived too late ? ?Samples given per MNT protocol. Patient educated on appropriate usage: ?Bariatric Advantage Multivitamin ?Lot # [id number] ?Exp:10/23 ? ?Ensure max  Shake ?Lot # [id number] ?Exp: 01/22/2022 ? ?The following the learning objectives were met by the patient during this course: ?Identify Pre-Op Dietary Goals and will begin 2 weeks pre-operatively ?Identify appropriate sources of fluids and proteins  ?State protein recommendations and appropriate sources pre and post-operatively ?Identify Post-Operative Dietary Goals and will follow for 2 weeks post-operatively ?Identify appropriate multivitamin and calcium sources ?Describe the need for physical activity post-operatively and will follow MD recommendations ?State when to call healthcare provider regarding medication questions or post-operative complications ?When having a diagnosis of diabetes understanding hypoglycemia symptoms and the inclusion of 1 complex carbohydrate per meal ? ?Handouts given during class include: ?Pre-Op Bariatric Surgery Diet Handout ?Protein Shake Handout ?Post-Op Bariatric Surgery Nutrition Handout ?BELT Program Information Flyer ?Support Group Information Flyer ?[facility] Bariatric Supplements Price List ? ?Follow-Up Plan: ?Patient will follow-up at [facility] 2 weeks post operatively for diet advancement per MD. ? ?

## 2022-01-19 ENCOUNTER — Ambulatory Visit (INDEPENDENT_AMBULATORY_CARE_PROVIDER_SITE_OTHER): Payer: BC Managed Care – PPO | Admitting: Psychology

## 2022-01-19 NOTE — Progress Notes (Signed)
Bariatric Evaluation  ?  ?     CONFIDENTIAL  ?  ?Client Name: Crystal Compton                                                                         MRN:  [id number] ?Date of Birth: July 17, 1986                                                  Date of Evaluation:  07/09/2021 ?Total Assessment Time: 64 Minutes                                                  Date of Report: 07/09/21 ?Evaluator: Delight Ovens, MSW, LCSW                                         Date of Follow-up:  07/30/2021 ?Referring Physician: Dr. Daphine Deutscher,                                                        Date of 2nd Follow-up: 01/19/22 ?[facility] ?  ?Sources of Information ?Background Interview ?Bariatric Questionnaire ?[place] Interview for Gastric Bypass ?Beck Anxiety Inventory (BAI) ?Beck Depression Inventory, 2nd Edition (BDI-II) ?Eating Disorder Diagnostic Scale (EDDS) ?Mental Status Examination ?Mood Disorder Questionnaire (MDQ) ?Review of Medical Record (provided by [facility]) ? ?Patient Identification and Chief Complaint: ?Crystal Compton was born and raised in [place], [place]. She currently lives in [place], [place] with with her three children ages 44, 28, and 5. Crystal Compton's family, and boyfriend, are aware and supportive of her pursuit in bariatric surgery.  ? ?Crystal Compton currently works for [employer] of [place] in [place]. 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 She generally enjoys her job and is hoping to work from home, post-op, for a few weeks while she heals from surgery.   ? ?Crystal Compton was recently diagnosed with Diabetes Type-2 but has not been prescribed any medication. She was recently prescribed Lexapro 10mg  qd and is slated to begin her medication. Additionally, she has started psychological treatment, with Ms. , LCSW to address mild depressive symptoms. Crystal Compton denied any tobacco use and consumes caffeinated drinks around 3x per week. She drinks alcohol sporadically and  socially.  ? ?Patient's Understanding of the Procedure: ?Crystal Compton discussed her interest in the gastric sleeve procedure. She provided a thorough overview of the surgical process including removal of portion of the stomach, the creation of a banana shaped pouch, sealing the pouch with staples, and the use of laparoscopy. She noted many of the possible risks of the surgery, including the use of anesthesia. She provided a thorough overview of  the dietary regimen and its varying stages, and discussed many of the behavioral changes in regards to eating. She noted her motivation for surgery to include improving health and becoming more physically active with 37 year old daughter. A brief overview of the less common side-effects of the surgery would be beneficial.  ? ?History of Weight Gains and Losses: ?Current weight, height, BMI, desired weight: 5'7?, 315lbs, ~215 ?Rate current activity level from 1 (sedentary) to 10 (very active): 3 ?Methods of exercise: swimming and walking.  ?Eating Disorder History (binging, purging, laxatives): Crystal Compton denied concerns.  ?History of dieting and results: ? ?2013: Working out [employer] Lost 65lbs, maintained weight-loss until pregnancy in 2017.  ?2022 - Present: [employer], walking or Swim, 1-2x per week & eating in moderation.   ? ?Diet and Plans for the Future: ?Current diet/typical meals:  ? ?Breakfast 2 scramble eggs & Bowl of grits  ?Lunch Shrimp or chicken stir fry  ?Dinner Delta Air LinesBaked chicken, rice, and broccoli  ?Snacks None  ? ?Plans for exercise post-surgery: Continue consistent exercise.  ?How pt copes with stress: [place]), spend time with family or boyfriend, getting support from others.  ?Other planned changes for the future: Improving overall quality of life and mobility.  ? ?Mental Status Examination: ?Crystal Compton was early to the session and completed all the necessary paperwork. She was dressed casually and her hygiene was observed to be good. She was oriented to  person, place, and time. Her attitude was cooperative and cheerful. There were no unusual psychomotor movements or changes. Speech patterns were normal in rate, tone, volume, and without pressure. Affect was reactive and mood congruent. Thought processes were goal-directed and logical. Memory and concentration for both long and short-term were intact. Insight and judgement were good overall.  ? ?Intellectual and Psychological Functioning ?Crystal Compton very warm and friendly. She presented with no significant formal thought disorder. Prior to the session, Crystal Compton completed the Mood Disorder Questionnaire - with no affirmatives for symptoms of Bipolar. Her depression screening, Beck's Depression Index netted a score of      5/63, which is indicative of ?normal life ups and downs? (depression symptomatology), Beck Anxiety Inventory netted a score   5/63 which is indicative of minimal anxiety symptomatology, and Eating Disorder Diagnostic Scale - of which Crystal Compton does not meet  criteria for. Additionally, Crystal Compton completed [facility] Interview for Gastric Bypass during the session which did not identify any symptoms or behaviors of possible disordered eating.  Overall, the results of the aforementioned screenings were unremarkable. Crystal Compton did not recently being prescribed Prozac 10mg  qd and will be picking up her prescription this week. She endorsed mild depressive symptoms and began counseling, as well, on the date of this evaluation. She provided a copy of her appointment confirmation email.  ? ?Conclusions & Recommendations ?Crystal Compton is a [age] year-old [place] female who [facility] by Dr. Daph[facility]Martin [facility] for a psychological evaluation to determine her suitability for bariatric surgery.  ? ?With regard to bariatric surgery, Ms. Crystal LikeLaportia Klawitter appears to be highly motivated and has a solid  understanding of the bariatric surgery, as well as risks and lifestyle changes needed to promote post-surgical weight loss success. Results of this evaluation yielded a past history of depression and anxiety, of which she is currently receiving treatment for. At this time, Ms. Crystal LikeLaportia Auble appears to be able to make an informed decision  about the surgery she is contemplating. The patient appears to be motivated and expressed understanding of the post-surgical requirements. If her surgery is scheduled more than 3 months from today's date (07/30/2021) she is required to schedule a follow-up visit in order for this examiner to briefly re-evaluate her psychological status at that time. This follow-up visit should occur within three months of her scheduled surgery date. ? ?The following recommendations are offered in order to promote Ms. Korine Kettles's health and well-being: ? ?It is recommended that the patient participate in educational sessions regarding a healthy diet and post-operative meal planning with a dietician or other health care providers.  ? ?Re-evaluation. If Ms. Alazay Nickless's surgery is scheduled more than three months from her date of approval, she is required to contact our office [phone]) for a brief check-in appointment about a month prior to her surgery date.  ? ?Nutritional Counseling. Ms. Antania Hoefling is strongly encouraged to continue attending nutritional counseling appointments in order to plan a healthy diet and post-operative meals. It should be noted that Ms. Jessi Thum's daily diet includes foods moderate in carbs and high in protein. she is encouraged to make recommended changes to her diet prior to surgery in order to increase the chances of continuing a healthy diet after surgery.  ? ?Exercise. Ms. Rebecka Oelkers is encouraged to participate in educational sessions on exercise that will be appropriate for her medical conditions and support her weight loss plans in a safe  and healthy manner. Specifically, she is encouraged to consider participating in the Bariatric Exercise and Lifestyle Transformation (BELT) program, a partnership between [facility] and [place]. There, you'll join fell

## 2022-01-19 NOTE — Progress Notes (Signed)
Time Spent: 12:39  pm - 1:27 pm : 48 Minutes ? ?[name] participated in the session via  video from office and consented to treatment. We met online due to COVID-19 pandemic. Therapist participated from home office. Ashtan consented to treatment.  ? ?The updated measures were reviewed prior to the assessment (10 minutes).  ? ?We completed the feedback session which includes reviewing the following measures: Beck Anxiety Inventory (BAI), Beck's Depression Index, Eating Disorder Diagnostic Scale (EDDS), Mental Status Examination (MSE) (10 minutes [administration and scoring]), & Mood Disorder Questionnaire (MDQ). The current measures were compared to the previous iteration of the measures, which was also reviewed during this feedback session. [name] provided an update since our last follow-up. An addendum was written (10 minutes) to integrate the results of the new measures, which included the update provided by the patient. The addendum was signed and submitted to the surgeon for review. Rainee Sweatt is aware and in agreement. ? ?The interactive feedback session was completed today and a total of 28 minutes was spent on feedback alone. Code 79217 was billed for feedback session.  ?Gerre Ranum is approved for surgery. ? ?Diagnosis code: e66.01 ? ?

## 2022-02-23 NOTE — Progress Notes (Signed)
Sent message, via epic in basket, requesting orders in epic from surgeon.  

## 2022-02-25 ENCOUNTER — Other Ambulatory Visit (HOSPITAL_COMMUNITY): Payer: Self-pay

## 2022-02-28 NOTE — Progress Notes (Signed)
DUE TO COVID-19 ONLY 2 VISITOR IS ALLOWED TO COME WITH YOU AND STAY IN THE WAITING ROOM ONLY DURING PRE OP AND PROCEDURE DAY OF SURGERY.   4 VISITOR  MAY VISIT WITH YOU AFTER SURGERY IN YOUR PRIVATE ROOM DURING VISITING HOURS ONLY! ?YOU MAY HAVE ONE PERSON SPEND THE NITE WITH YOU IN YOUR ROOM AFTER SURGERY.   ? ? ? ? Your procedure is scheduled on:  ?       03/08/2022  ? Report to [facility] Main  Entrance ? ? Report to admitting at      1130            AM ?DO NOT BRING INSURANCE CARD, PICTURE ID OR WALLET DAY OF SURGERY.  ?  ? ? Call this number if you have problems the morning of surgery [phone]  ? ? REMEMBER: NO  SOLID FOODS , CANDY, GUM OR MINTS AFTER MIDNITE THE NITE BEFORE SURGERY .       Marland Kitchen CLEAR LIQUIDS UNTIL                DAY OF SURGERY.      ? ? ?CLEAR LIQUID DIET ? ? ?Foods Allowed      ?WATER ?BLACK COFFEE ( SUGAR OK, NO MILK, CREAM OR CREAMER) REGULAR AND DECAF  ?TEA ( SUGAR OK NO MILK, CREAM, OR CREAMER) REGULAR AND DECAF  ?PLAIN JELLO ( NO RED)  ?FRUIT ICES ( NO RED, NO FRUIT PULP)  ?POPSICLES ( NO RED)  ?JUICE- APPLE, WHITE GRAPE AND WHITE CRANBERRY  ?SPORT DRINK LIKE GATORADE ( NO RED)  ?CLEAR BROTH ( VEGETABLE , CHICKEN OR BEEF)                                                               ? ?    ? ?BRUSH YOUR TEETH MORNING OF SURGERY AND RINSE YOUR MOUTH OUT, NO CHEWING GUM CANDY OR MINTS. ?  ? ? Take these medicines the morning of surgery with A SIP OF WATER:  none  ? ? ?DO NOT TAKE ANY DIABETIC MEDICATIONS DAY OF YOUR SURGERY ?                  ?            You may not have any metal on your body including hair pins and  ?            piercings  Do not wear jewelry, make-up, lotions, powders or perfumes, deodorant ?            Do not wear nail polish on your fingernails.   ?           IF YOU ARE A FEMALE AND WANT TO SHAVE UNDER ARMS OR LEGS PRIOR TO SURGERY YOU MUST DO SO AT LEAST 48 HOURS PRIOR TO SURGERY.  ?            Men may shave face and neck. ? ? Do not bring valuables to the  hospital. [facility] IS NOT ?            RESPONSIBLE   FOR VALUABLES. ? Contacts, dentures or bridgework may not be worn into surgery. ? Leave suitcase in the car. After surgery it may be brought  to your room. ? ?  ? Patients discharged the day of surgery will not be allowed to drive home. IF YOU ARE HAVING SURGERY AND GOING HOME THE SAME DAY, YOU MUST HAVE AN ADULT TO DRIVE YOU HOME AND BE WITH YOU FOR 24 HOURS. YOU MAY GO HOME BY TAXI OR UBER OR ORTHERWISE, BUT AN ADULT MUST ACCOMPANY YOU HOME AND STAY WITH YOU FOR 24 HOURS. ?  ? ?            Please read over the following fact sheets you were given: ?_____________________________________________________________________ ? ?[facility] - Preparing for Surgery ?Before surgery, you can play an important role.  Because skin is not sterile, your skin needs to be as free of germs as possible.  You can reduce the number of germs on your skin by washing with CHG (chlorahexidine gluconate) soap before surgery.  CHG is an antiseptic cleaner which kills germs and bonds with the skin to continue killing germs even after washing. ?Please DO NOT use if you have an allergy to CHG or antibacterial soaps.  If your skin becomes reddened/irritated stop using the CHG and inform your nurse when you arrive at Short Stay. ?Do not shave (including legs and underarms) for at least 48 hours prior to the first CHG shower.  You may shave your face/neck. ?Please follow these instructions carefully: ? 1.  Shower with CHG Soap the night before surgery and the  morning of Surgery. ? 2.  If you choose to wash your hair, wash your hair first as usual with your  normal  shampoo. ? 3.  After you shampoo, rinse your hair and body thoroughly to remove the  shampoo.                           4.  Use CHG as you would any other liquid soap.  You can apply chg directly  to the skin and wash  ?                     Gently with a scrungie or clean washcloth. ? 5.  Apply the CHG Soap to your body ONLY FROM  THE NECK DOWN.   Do not use on face/ open      ?                     Wound or open sores. Avoid contact with eyes, ears mouth and genitals (private parts).  ?                     Engineering geologist,  Genitals (private parts) with your normal soap. ?            6.  Wash thoroughly, paying special attention to the area where your surgery  will be performed. ? 7.  Thoroughly rinse your body with warm water from the neck down. ? 8.  DO NOT shower/wash with your normal soap after using and rinsing off  the CHG Soap. ?               9.  Pat yourself dry with a clean towel. ?           10.  Wear clean pajamas. ?           11.  Place clean sheets on your bed the night of your first shower and do not  sleep with pets. ?Day of Surgery : ?  Do not apply any lotions/deodorants the morning of surgery.  Please wear clean clothes to the hospital/surgery center. ? ?FAILURE TO FOLLOW THESE INSTRUCTIONS MAY RESULT IN THE CANCELLATION OF YOUR SURGERY ?PATIENT SIGNATURE_________________________________ ? ?NURSE SIGNATURE__________________________________ ? ?________________________________________________________________________  ? ? ?           ?

## 2022-02-28 NOTE — Progress Notes (Addendum)
Anesthesia Review: ? ?PCP: Doug Sou now to be [facility] with Romelle Starcher  LOV with Suzy Bouchard on 02/17/22. - in epic  ?Cardiologist : Frederik Pear ?  ?Wells Guiles Every [place] 02/07/22  ?Chest x-ray : ?EKG : 02/17/22  ?Echo : ?Stress test: ?Cardiac Cath :  ?Activity level: can do a flight of stairs without difficulty  ?Sleep Study/ CPAP : none  ?Fasting Blood Sugar :      / Checks Blood Sugar -- times a day:   ?Blood Thinner/ Instructions /Last Dose: ?ASA / Instructions/ Last Dose :   ?Orders requested on 02/28/2022.   ?Blood pressure at preop appt was 191/124 in right arm and in left arm was 188/102 and in left lower arm  was 154/113.  Pt denies any chest pain , headache, dizziness, or shortness of breath.  No hx of hypertension.  EKG done.  Jessica Ward,PAC made awaare of blood pressure readings. And EKG results.   PT has copy of blood pressure readings.  Per Janett Billow Ward, PAC pt instructed needs to see PCP or cardiology prior to siurgery on 03/08/22.  PT voiced understanding.   ?Orders requested on 02/28/2022 and 03/02/2022.  NO orders in at preop appt.  Andorders requested on 02/23/2022.   ?Prediabetes- does not check glucose at home on no meds  ?Hgba1c- 03/02/22- 6.6  ?

## 2022-03-02 ENCOUNTER — Encounter: Payer: BC Managed Care – PPO | Admitting: Psychology

## 2022-03-02 ENCOUNTER — Encounter (HOSPITAL_COMMUNITY)
Admission: RE | Admit: 2022-03-02 | Discharge: 2022-03-02 | Disposition: A | Discharge status: Home / Self Care | Payer: BC Managed Care – PPO | Source: Ambulatory Visit | Attending: Surgery | Admitting: Surgery

## 2022-03-02 ENCOUNTER — Other Ambulatory Visit: Payer: Self-pay

## 2022-03-02 ENCOUNTER — Encounter (HOSPITAL_COMMUNITY): Payer: Self-pay

## 2022-03-02 VITALS — BP 188/102 | HR 74 | Temp 98.2°F | Resp 16 | Ht 66.0 in | Wt 320.0 lb

## 2022-03-02 DIAGNOSIS — Z01818 Encounter for other preprocedural examination: Secondary | ICD-10-CM | POA: Insufficient documentation

## 2022-03-02 DIAGNOSIS — E669 Obesity, unspecified: Secondary | ICD-10-CM | POA: Diagnosis not present

## 2022-03-02 DIAGNOSIS — F3289 Other specified depressive episodes: Secondary | ICD-10-CM

## 2022-03-02 HISTORY — DX: Anxiety disorder, unspecified: F41.9

## 2022-03-02 HISTORY — DX: Depression, unspecified: F32.A

## 2022-03-02 HISTORY — DX: Prediabetes: R73.03

## 2022-03-02 LAB — HEMOGLOBIN A1C
Hgb A1c MFr Bld: 6.6 % — ABNORMAL HIGH (ref 4.8–5.6)
Mean Plasma Glucose: 142.72 mg/dL

## 2022-03-02 LAB — GLUCOSE, CAPILLARY: Glucose-Capillary: 147 mg/dL — ABNORMAL HIGH (ref 70–99)

## 2022-03-02 LAB — BASIC METABOLIC PANEL
Anion gap: 7 (ref 5–15)
BUN: 7 mg/dL (ref 6–20)
CO2: 22 mmol/L (ref 22–32)
Calcium: 8.9 mg/dL (ref 8.9–10.3)
Chloride: 108 mmol/L (ref 98–111)
Creatinine, Ser: 0.58 mg/dL (ref 0.44–1.00)
GFR, Estimated: >60 mL/min (ref >60)
Glucose, Bld: 149 mg/dL — ABNORMAL HIGH (ref 70–99)
Potassium: 4 mmol/L (ref 3.5–5.1)
Sodium: 137 mmol/L (ref 135–145)

## 2022-03-02 LAB — CBC
HCT: 38.6 % (ref 36.0–46.0)
Hemoglobin: 12.2 g/dL (ref 12.0–15.0)
MCH: 25 pg — ABNORMAL LOW (ref 26.0–34.0)
MCHC: 31.6 g/dL (ref 30.0–36.0)
MCV: 79.1 fL — ABNORMAL LOW (ref 80.0–100.0)
Platelets: 283 K/uL (ref 150–400)
RBC: 4.88 MIL/uL (ref 3.87–5.11)
RDW: 14.1 % (ref 11.5–15.5)
WBC: 6.2 K/uL (ref 4.0–10.5)
nRBC: 0 % (ref 0.0–0.2)

## 2022-03-02 NOTE — Progress Notes (Signed)
This encounter was created in error - please disregard.

## 2022-03-06 NOTE — H&P (Signed)
PROVIDER: Joya San, MD ? ?MRN: [id number] ?DOB: [birth year] ? ?CC obesity ? ? ?History of Present Illness: ?Crystal Compton is a 36 y.o. female who is seen today as an office consultation at the request of Dr. Pasty Arch for evaluation before sleeve gastrectomy.   ?.  ?She was seen in this office on December 02, 2020 and wanted to reenter the bariatric program and have a sleeve gastrectomy. We went over sleeve gastrectomy with her in detail including complications not limited to leaks and bleeding. She denies GERD's and had an upper GI prior to to this. She has borderline diabetes. She does not have sleep apnea. She had previously begun the bariatric pathway at [facility]. She has a good fund of knowledge and would be a good candidate for sleeve gastrectomy. ? ?Review of Systems: ?See HPI as well for other ROS. ? ?ROS  ? ?Medical History: ?Past Medical History:  ?Diagnosis Date  ? Hyperlipidemia  ? Hypertension  ? ?Patient Active Problem List  ?Diagnosis  ? Morbid obesity (CMS-HCC)  ? OSA (obstructive sleep apnea)  ? S/P appendectomy  ? Type 2 diabetes mellitus without complication, without long-term current use of insulin (CMS-HCC)  ? ?Past Surgical History:  ?Procedure Laterality Date  ? APPENDECTOMY  ? ? ?No Known Allergies ? ?Current Outpatient Medications on File Prior to Visit  ?Medication Sig Dispense Refill  ? methocarbamoL (ROBAXIN) 500 MG tablet Take 500 mg by mouth 2 (two) times daily  ? glyBURIDE (DIABETA) 2.5 MG tablet Take by mouth  ? naproxen (NAPROSYN) 500 MG tablet  ? sertraline (ZOLOFT) 50 MG tablet  ? ?No current facility-administered medications on file prior to visit.  ? ?History reviewed. No pertinent family history.  ? ?Social History  ? ?Tobacco Use  ?Smoking Status Never  ?Smokeless Tobacco Never  ? ? ?Social History  ? ?Socioeconomic History  ? Marital status: Single  ?Tobacco Use  ? Smoking status: Never  ? Smokeless tobacco: Never  ?Vaping Use  ? Vaping Use: Never used  ?Substance and  Sexual Activity  ? Alcohol use: Yes  ? Drug use: Never  ? ?Objective:  ? ?Vitals:  ?Weight: (!) 139.8 kg (308 lb 3.2 oz)  ?Height: 170.2 cm (5\' 7" )  ? ?Body mass index is 48.27 kg/m?. ? ?Physical Exam ?General: Obese African-American female no acute distress. ?HEENT : Normocephalic; blue eyes with pupils equal round reactive light nose and throat exam unremarkable ?Chest: Clear to auscultation ?Heart: Sinus rhythm without murmurs or gallops ?Breast: Not examined ?Abdomen: Obese. We went over where her trochars would be placed. The only conflict with her long upper midline tattoo would be where the liver retractor will sit. ?GU not examined ?Rectal not performed ?Extremities full range of motion ?Neuro alert and oriented x3. Motor and sensory function grossly intact ? ?Labs, Imaging and Diagnostic Testing: ?Labs reviewed ? ?Assessment and Plan:  ? ? ?Morbid (severe) obesity due to excess calories (CMS-HCC) ?For sleeve gastrectomy ? ? ?Patient's had several months of supervised weight loss with the nutrition and diabetes service. She is ready to proceed with scheduling for sleeve gastrectomy.  All questions were answered in the holding area.  ? ? ? ?Crystal Moralez Donia Pounds, MD   ? ?

## 2022-03-08 ENCOUNTER — Encounter (HOSPITAL_COMMUNITY): Payer: Self-pay | Admitting: Surgery

## 2022-03-08 ENCOUNTER — Inpatient Hospital Stay (HOSPITAL_COMMUNITY): Payer: BC Managed Care – PPO | Admitting: Anesthesiology

## 2022-03-08 ENCOUNTER — Encounter (HOSPITAL_COMMUNITY)
Admission: RE | Disposition: A | Discharge status: Home / Self Care | Payer: Self-pay | Source: Ambulatory Visit | Attending: Surgery

## 2022-03-08 ENCOUNTER — Other Ambulatory Visit: Payer: Self-pay

## 2022-03-08 ENCOUNTER — Inpatient Hospital Stay (HOSPITAL_COMMUNITY)
Admission: RE | Admit: 2022-03-08 | Discharge: 2022-03-09 | DRG: 621 | Disposition: A | Discharge status: Home / Self Care | Payer: BC Managed Care – PPO | Source: Ambulatory Visit | Attending: Surgery | Admitting: Surgery

## 2022-03-08 ENCOUNTER — Inpatient Hospital Stay (HOSPITAL_COMMUNITY): Payer: BC Managed Care – PPO | Admitting: Physician Assistant

## 2022-03-08 DIAGNOSIS — Z6841 Body Mass Index (BMI) 40.0 and over, adult: Secondary | ICD-10-CM

## 2022-03-08 DIAGNOSIS — I10 Essential (primary) hypertension: Secondary | ICD-10-CM | POA: Diagnosis present

## 2022-03-08 DIAGNOSIS — Z79899 Other long term (current) drug therapy: Secondary | ICD-10-CM | POA: Diagnosis not present

## 2022-03-08 DIAGNOSIS — G4733 Obstructive sleep apnea (adult) (pediatric): Secondary | ICD-10-CM | POA: Diagnosis present

## 2022-03-08 DIAGNOSIS — E119 Type 2 diabetes mellitus without complications: Secondary | ICD-10-CM | POA: Diagnosis present

## 2022-03-08 DIAGNOSIS — Z87891 Personal history of nicotine dependence: Secondary | ICD-10-CM

## 2022-03-08 DIAGNOSIS — E785 Hyperlipidemia, unspecified: Secondary | ICD-10-CM | POA: Diagnosis present

## 2022-03-08 DIAGNOSIS — Z9884 Bariatric surgery status: Secondary | ICD-10-CM

## 2022-03-08 DIAGNOSIS — Z86711 Personal history of pulmonary embolism: Secondary | ICD-10-CM | POA: Diagnosis not present

## 2022-03-08 HISTORY — PX: UPPER GI ENDOSCOPY: SHX6162

## 2022-03-08 LAB — COMPREHENSIVE METABOLIC PANEL
ALT: 31 U/L (ref 0–44)
AST: 31 U/L (ref 15–41)
Albumin: 3.7 g/dL (ref 3.5–5.0)
Alkaline Phosphatase: 35 U/L — ABNORMAL LOW (ref 38–126)
Anion gap: 5 (ref 5–15)
BUN: 10 mg/dL (ref 6–20)
CO2: 23 mmol/L (ref 22–32)
Calcium: 8.5 mg/dL — ABNORMAL LOW (ref 8.9–10.3)
Chloride: 109 mmol/L (ref 98–111)
Creatinine, Ser: 0.6 mg/dL (ref 0.44–1.00)
GFR, Estimated: >60 mL/min (ref >60)
Glucose, Bld: 119 mg/dL — ABNORMAL HIGH (ref 70–99)
Potassium: 4.1 mmol/L (ref 3.5–5.1)
Sodium: 137 mmol/L (ref 135–145)
Total Bilirubin: 0.5 mg/dL (ref 0.3–1.2)
Total Protein: 7 g/dL (ref 6.5–8.1)

## 2022-03-08 LAB — HEMOGLOBIN AND HEMATOCRIT, BLOOD
HCT: 37.4 % (ref 36.0–46.0)
Hemoglobin: 11.6 g/dL — ABNORMAL LOW (ref 12.0–15.0)

## 2022-03-08 LAB — CBC
HCT: 38.4 % (ref 36.0–46.0)
Hemoglobin: 12 g/dL (ref 12.0–15.0)
MCH: 24.9 pg — ABNORMAL LOW (ref 26.0–34.0)
MCHC: 31.3 g/dL (ref 30.0–36.0)
MCV: 79.8 fL — ABNORMAL LOW (ref 80.0–100.0)
Platelets: 297 10*3/uL (ref 150–400)
RBC: 4.81 MIL/uL (ref 3.87–5.11)
RDW: 14.3 % (ref 11.5–15.5)
WBC: 12 10*3/uL — ABNORMAL HIGH (ref 4.0–10.5)
nRBC: 0 % (ref 0.0–0.2)

## 2022-03-08 LAB — CREATININE, SERUM
Creatinine, Ser: 0.74 mg/dL (ref 0.44–1.00)
GFR, Estimated: >60 mL/min (ref >60)

## 2022-03-08 LAB — TYPE AND SCREEN
ABO/RH(D): AB POS
Antibody Screen: NEGATIVE

## 2022-03-08 LAB — GLUCOSE, CAPILLARY: Glucose-Capillary: 140 mg/dL — ABNORMAL HIGH (ref 70–99)

## 2022-03-08 LAB — ABO/RH: ABO/RH(D): AB POS

## 2022-03-08 LAB — PREGNANCY, URINE: Preg Test, Ur: NEGATIVE

## 2022-03-08 SURGERY — GASTRECTOMY, SLEEVE, ROBOT-ASSISTED
Anesthesia: General

## 2022-03-08 MED ORDER — SODIUM CHLORIDE 0.9 % IR SOLN
Status: DC | PRN
Start: 2022-03-08 — End: 2022-03-08
  Administered 2022-03-08: 1000 mL

## 2022-03-08 MED ORDER — SODIUM CHLORIDE (PF) 0.9 % IJ SOLN
INTRAMUSCULAR | Status: DC | PRN
  Administered 2022-03-08: 10 mL

## 2022-03-08 MED ORDER — ROCURONIUM BROMIDE 100 MG/10ML IV SOLN
INTRAVENOUS | Status: DC | PRN
Start: 2022-03-08 — End: 2022-03-08
  Administered 2022-03-08: 10 mg via INTRAVENOUS
  Administered 2022-03-08: 80 mg via INTRAVENOUS
  Administered 2022-03-08: 20 mg via INTRAVENOUS

## 2022-03-08 MED ORDER — HYDRALAZINE HCL 20 MG/ML IJ SOLN
INTRAMUSCULAR | Status: AC
  Administered 2022-03-08: 10 mg via INTRAVENOUS
  Filled 2022-03-08: qty 1

## 2022-03-08 MED ORDER — SODIUM CHLORIDE 0.9 % IV SOLN
2.0000 g | INTRAVENOUS | Status: AC
  Administered 2022-03-08: 2 g via INTRAVENOUS
  Filled 2022-03-08: qty 2

## 2022-03-08 MED ORDER — PANTOPRAZOLE SODIUM 40 MG IV SOLR
40.0000 mg | Freq: Every day | INTRAVENOUS | Status: DC
  Administered 2022-03-08: 40 mg via INTRAVENOUS
  Filled 2022-03-08: qty 10

## 2022-03-08 MED ORDER — ACETAMINOPHEN 500 MG PO TABS
1000.0000 mg | ORAL_TABLET | ORAL | Status: AC
  Administered 2022-03-08: 1000 mg via ORAL
  Filled 2022-03-08: qty 2

## 2022-03-08 MED ORDER — SUGAMMADEX SODIUM 500 MG/5ML IV SOLN
INTRAVENOUS | Status: DC | PRN
  Administered 2022-03-08: 350 mg via INTRAVENOUS

## 2022-03-08 MED ORDER — ENSURE MAX PROTEIN PO LIQD
2.0000 [oz_av] | ORAL | Status: DC
  Administered 2022-03-09 (×3): 2 [oz_av] via ORAL

## 2022-03-08 MED ORDER — ROCURONIUM BROMIDE 10 MG/ML (PF) SYRINGE
PREFILLED_SYRINGE | INTRAVENOUS | Status: AC
  Filled 2022-03-08: qty 10

## 2022-03-08 MED ORDER — PROPOFOL 10 MG/ML IV BOLUS
INTRAVENOUS | Status: AC
  Filled 2022-03-08: qty 20

## 2022-03-08 MED ORDER — DEXAMETHASONE SODIUM PHOSPHATE 10 MG/ML IJ SOLN
INTRAMUSCULAR | Status: AC
  Filled 2022-03-08: qty 1

## 2022-03-08 MED ORDER — CHLORHEXIDINE GLUCONATE CLOTH 2 % EX PADS: 6.0000 | MEDICATED_PAD | Freq: Once | CUTANEOUS | Status: DC

## 2022-03-08 MED ORDER — FENTANYL CITRATE PF 50 MCG/ML IJ SOSY
25.0000 ug | PREFILLED_SYRINGE | INTRAMUSCULAR | Status: DC | PRN
  Administered 2022-03-08: 25 ug via INTRAVENOUS
  Administered 2022-03-08: 50 ug via INTRAVENOUS

## 2022-03-08 MED ORDER — OXYCODONE HCL 5 MG/5ML PO SOLN
5.0000 mg | Freq: Four times a day (QID) | ORAL | Status: DC | PRN
  Administered 2022-03-08 – 2022-03-09 (×2): 5 mg via ORAL
  Filled 2022-03-08 (×2): qty 5

## 2022-03-08 MED ORDER — SUGAMMADEX SODIUM 500 MG/5ML IV SOLN
INTRAVENOUS | Status: AC
  Filled 2022-03-08: qty 5

## 2022-03-08 MED ORDER — CHLORHEXIDINE GLUCONATE 0.12 % MT SOLN
15.0000 mL | Freq: Once | OROMUCOSAL | Status: AC
  Administered 2022-03-08: 15 mL via OROMUCOSAL

## 2022-03-08 MED ORDER — GLYCOPYRROLATE 0.2 MG/ML IJ SOLN
INTRAMUSCULAR | Status: DC | PRN
  Administered 2022-03-08: .2 mg via INTRAVENOUS

## 2022-03-08 MED ORDER — BUPIVACAINE LIPOSOME 1.3 % IJ SUSP: 20.0000 mL | Freq: Once | INTRAMUSCULAR | Status: DC

## 2022-03-08 MED ORDER — ACETAMINOPHEN 160 MG/5ML PO SOLN: 1000.0000 mg | Freq: Three times a day (TID) | ORAL | Status: DC

## 2022-03-08 MED ORDER — LIDOCAINE HCL (PF) 2 % IJ SOLN
INTRAMUSCULAR | Status: AC
  Filled 2022-03-08: qty 5

## 2022-03-08 MED ORDER — MIDAZOLAM HCL 2 MG/2ML IJ SOLN
INTRAMUSCULAR | Status: AC
  Filled 2022-03-08: qty 2

## 2022-03-08 MED ORDER — BUPIVACAINE LIPOSOME 1.3 % IJ SUSP
INTRAMUSCULAR | Status: AC
  Filled 2022-03-08: qty 20

## 2022-03-08 MED ORDER — MORPHINE SULFATE (PF) 2 MG/ML IV SOLN
1.0000 mg | INTRAVENOUS | Status: DC | PRN
  Administered 2022-03-08: 2 mg via INTRAVENOUS
  Filled 2022-03-08: qty 1

## 2022-03-08 MED ORDER — ONDANSETRON HCL 4 MG/2ML IJ SOLN
INTRAMUSCULAR | Status: AC
  Filled 2022-03-08: qty 2

## 2022-03-08 MED ORDER — FENTANYL CITRATE (PF) 100 MCG/2ML IJ SOLN
INTRAMUSCULAR | Status: AC
  Filled 2022-03-08: qty 2

## 2022-03-08 MED ORDER — FENTANYL CITRATE (PF) 250 MCG/5ML IJ SOLN
INTRAMUSCULAR | Status: AC
  Filled 2022-03-08: qty 5

## 2022-03-08 MED ORDER — LACTATED RINGERS IV SOLN: INTRAVENOUS | Status: DC

## 2022-03-08 MED ORDER — BUPIVACAINE LIPOSOME 1.3 % IJ SUSP
INTRAMUSCULAR | Status: DC | PRN
  Administered 2022-03-08: 20 mL

## 2022-03-08 MED ORDER — FENTANYL CITRATE PF 50 MCG/ML IJ SOSY
PREFILLED_SYRINGE | INTRAMUSCULAR | Status: AC
  Administered 2022-03-08: 25 ug via INTRAVENOUS
  Filled 2022-03-08: qty 3

## 2022-03-08 MED ORDER — SODIUM CHLORIDE (PF) 0.9 % IJ SOLN
INTRAMUSCULAR | Status: AC
  Filled 2022-03-08: qty 10

## 2022-03-08 MED ORDER — ONDANSETRON HCL 4 MG/2ML IJ SOLN
4.0000 mg | INTRAMUSCULAR | Status: DC | PRN
  Administered 2022-03-08: 4 mg via INTRAVENOUS
  Filled 2022-03-08: qty 2

## 2022-03-08 MED ORDER — AMISULPRIDE (ANTIEMETIC) 5 MG/2ML IV SOLN: 10.0000 mg | Freq: Once | INTRAVENOUS | Status: DC | PRN

## 2022-03-08 MED ORDER — ORAL CARE MOUTH RINSE: 15.0000 mL | Freq: Once | OROMUCOSAL | Status: AC

## 2022-03-08 MED ORDER — HYDRALAZINE HCL 20 MG/ML IJ SOLN: 10.0000 mg | Freq: Once | INTRAMUSCULAR | Status: AC | PRN

## 2022-03-08 MED ORDER — PROPOFOL 10 MG/ML IV BOLUS
INTRAVENOUS | Status: DC | PRN
  Administered 2022-03-08: 200 mg via INTRAVENOUS

## 2022-03-08 MED ORDER — OXYCODONE HCL 5 MG PO TABS: 5.0000 mg | ORAL_TABLET | Freq: Once | ORAL | Status: DC | PRN

## 2022-03-08 MED ORDER — DEXAMETHASONE SODIUM PHOSPHATE 10 MG/ML IJ SOLN
INTRAMUSCULAR | Status: DC | PRN
Start: 2022-03-08 — End: 2022-03-08
  Administered 2022-03-08: 4 mg via INTRAVENOUS

## 2022-03-08 MED ORDER — LIDOCAINE HCL (CARDIAC) PF 100 MG/5ML IV SOSY
PREFILLED_SYRINGE | INTRAVENOUS | Status: DC | PRN
  Administered 2022-03-08: 50 mg via INTRAVENOUS

## 2022-03-08 MED ORDER — ONDANSETRON HCL 4 MG/2ML IJ SOLN: 4.0000 mg | Freq: Once | INTRAMUSCULAR | Status: DC | PRN

## 2022-03-08 MED ORDER — FENTANYL CITRATE (PF) 100 MCG/2ML IJ SOLN
INTRAMUSCULAR | Status: DC | PRN
  Administered 2022-03-08: 100 ug via INTRAVENOUS
  Administered 2022-03-08 (×5): 50 ug via INTRAVENOUS

## 2022-03-08 MED ORDER — METOPROLOL TARTRATE 5 MG/5ML IV SOLN: 5.0000 mg | Freq: Four times a day (QID) | INTRAVENOUS | Status: DC | PRN

## 2022-03-08 MED ORDER — HEPARIN SODIUM (PORCINE) 5000 UNIT/ML IJ SOLN
5000.0000 [IU] | Freq: Three times a day (TID) | INTRAMUSCULAR | Status: DC
  Administered 2022-03-08 – 2022-03-09 (×3): 5000 [IU] via SUBCUTANEOUS
  Filled 2022-03-08 (×3): qty 1

## 2022-03-08 MED ORDER — SCOPOLAMINE 1 MG/3DAYS TD PT72
1.0000 | MEDICATED_PATCH | TRANSDERMAL | Status: DC
  Administered 2022-03-08: 1.5 mg via TRANSDERMAL
  Filled 2022-03-08: qty 1

## 2022-03-08 MED ORDER — STERILE WATER FOR IRRIGATION IR SOLN
Status: DC | PRN
  Administered 2022-03-08: 1000 mL

## 2022-03-08 MED ORDER — HEPARIN SODIUM (PORCINE) 5000 UNIT/ML IJ SOLN
5000.0000 [IU] | INTRAMUSCULAR | Status: AC
  Administered 2022-03-08: 5000 [IU] via SUBCUTANEOUS
  Filled 2022-03-08: qty 1

## 2022-03-08 MED ORDER — LACTATED RINGERS IR SOLN
Status: DC | PRN
Start: 2022-03-08 — End: 2022-03-08
  Administered 2022-03-08: 1000 mL

## 2022-03-08 MED ORDER — APREPITANT 40 MG PO CAPS
40.0000 mg | ORAL_CAPSULE | ORAL | Status: AC
  Administered 2022-03-08: 40 mg via ORAL
  Filled 2022-03-08: qty 1

## 2022-03-08 MED ORDER — ACETAMINOPHEN 500 MG PO TABS
1000.0000 mg | ORAL_TABLET | Freq: Three times a day (TID) | ORAL | Status: DC
  Administered 2022-03-08 – 2022-03-09 (×3): 1000 mg via ORAL
  Filled 2022-03-08 (×3): qty 2

## 2022-03-08 MED ORDER — KCL IN DEXTROSE-NACL 20-5-0.45 MEQ/L-%-% IV SOLN
INTRAVENOUS | Status: DC
  Filled 2022-03-08: qty 1000

## 2022-03-08 MED ORDER — MIDAZOLAM HCL 5 MG/5ML IJ SOLN
INTRAMUSCULAR | Status: DC | PRN
  Administered 2022-03-08: 2 mg via INTRAVENOUS

## 2022-03-08 MED ORDER — OXYCODONE HCL 5 MG/5ML PO SOLN: 5.0000 mg | Freq: Once | ORAL | Status: DC | PRN

## 2022-03-08 MED ORDER — ONDANSETRON HCL 4 MG/2ML IJ SOLN
INTRAMUSCULAR | Status: DC | PRN
  Administered 2022-03-08: 4 mg via INTRAVENOUS

## 2022-03-08 SURGICAL SUPPLY — 69 items
APPLIER CLIP 5 13 M/L LIGAMAX5 (MISCELLANEOUS)
APPLIER CLIP ROT 10 11.4 M/L (STAPLE)
BLADE SURG 15 STRL LF DISP TIS (BLADE) ×1 IMPLANT
BLADE SURG 15 STRL SS (BLADE) ×1
CANNULA REDUC XI 12-8 STAPL (CANNULA) ×1
CANNULA REDUCER 12-8 DVNC XI (CANNULA) ×1 IMPLANT
CHLORAPREP W/TINT 26 (MISCELLANEOUS) ×2 IMPLANT
CLIP APPLIE 5 13 M/L LIGAMAX5 (MISCELLANEOUS) IMPLANT
CLIP APPLIE ROT 10 11.4 M/L (STAPLE) IMPLANT
COVER SURGICAL LIGHT HANDLE (MISCELLANEOUS) ×2 IMPLANT
DERMABOND ADVANCED (GAUZE/BANDAGES/DRESSINGS) ×1
DERMABOND ADVANCED .7 DNX12 (GAUZE/BANDAGES/DRESSINGS) ×1 IMPLANT
DRAPE ARM DVNC X/XI (DISPOSABLE) ×4 IMPLANT
DRAPE COLUMN DVNC XI (DISPOSABLE) ×1 IMPLANT
DRAPE DA VINCI XI ARM (DISPOSABLE) ×4
DRAPE DA VINCI XI COLUMN (DISPOSABLE) ×1
ELECT REM PT RETURN 15FT ADLT (MISCELLANEOUS) ×2 IMPLANT
GLOVE BIO SURGEON STRL SZ 6 (GLOVE) ×6 IMPLANT
GLOVE BIO SURGEON STRL SZ8 (GLOVE) ×4 IMPLANT
GLOVE INDICATOR 6.5 STRL GRN (GLOVE) ×6 IMPLANT
GOWN STRL REUS W/ TWL XL LVL3 (GOWN DISPOSABLE) ×3 IMPLANT
GOWN STRL REUS W/TWL XL LVL3 (GOWN DISPOSABLE) ×3
GRASPER SUT TROCAR 14GX15 (MISCELLANEOUS) ×2 IMPLANT
IRRIG SUCT STRYKERFLOW 2 WTIP (MISCELLANEOUS) ×2
IRRIGATION SUCT STRKRFLW 2 WTP (MISCELLANEOUS) ×1 IMPLANT
KIT BASIN OR (CUSTOM PROCEDURE TRAY) ×2 IMPLANT
KIT TURNOVER KIT A (KITS) IMPLANT
LUBRICANT JELLY K Y 4OZ (MISCELLANEOUS) IMPLANT
MARKER SKIN DUAL TIP RULER LAB (MISCELLANEOUS) ×2 IMPLANT
MAT PREVALON FULL STRYKER (MISCELLANEOUS) ×2 IMPLANT
NDL SPNL 22GX3.5 QUINCKE BK (NEEDLE) ×1 IMPLANT
NEEDLE SPNL 22GX3.5 QUINCKE BK (NEEDLE) ×2 IMPLANT
OBTURATOR OPTICAL STANDARD 8MM (TROCAR) ×1
OBTURATOR OPTICAL STND 8 DVNC (TROCAR) ×1
OBTURATOR OPTICALSTD 8 DVNC (TROCAR) ×1 IMPLANT
PACK CARDIOVASCULAR III (CUSTOM PROCEDURE TRAY) ×2 IMPLANT
RELOAD STAPLE 60 2.5 WHT DVNC (STAPLE) IMPLANT
RELOAD STAPLE 60 3.5 BLU DVNC (STAPLE) IMPLANT
RELOAD STAPLER 2.5X60 WHT DVNC (STAPLE) ×6 IMPLANT
RELOAD STAPLER 3.5X60 BLU DVNC (STAPLE) ×2 IMPLANT
SCISSORS LAP 5X35 DISP (ENDOMECHANICALS) IMPLANT
SEAL CANN UNIV 5-8 DVNC XI (MISCELLANEOUS) ×3 IMPLANT
SEAL XI 5MM-8MM UNIVERSAL (MISCELLANEOUS) ×4
SEALER VESSEL DA VINCI XI (MISCELLANEOUS) ×1
SEALER VESSEL EXT DVNC XI (MISCELLANEOUS) ×1 IMPLANT
SLEEVE GASTRECTOMY 36FR VISIGI (MISCELLANEOUS) ×2 IMPLANT
SOL ANTI FOG 6CC (MISCELLANEOUS) ×1 IMPLANT
SOLUTION ANTI FOG 6CC (MISCELLANEOUS) ×1
SOLUTION ELECTROLUBE (MISCELLANEOUS) ×2 IMPLANT
SPIKE FLUID TRANSFER (MISCELLANEOUS) ×2 IMPLANT
STAPLER 60 DA VINCI SURE FORM (STAPLE) ×1
STAPLER 60 SUREFORM DVNC (STAPLE) ×1 IMPLANT
STAPLER CANNULA SEAL DVNC XI (STAPLE) ×1 IMPLANT
STAPLER CANNULA SEAL XI (STAPLE) ×1
STAPLER RELOAD 2.5X60 WHITE (STAPLE) ×6
STAPLER RELOAD 2.5X60 WHT DVNC (STAPLE) ×6
STAPLER RELOAD 3.5X60 BLU DVNC (STAPLE) ×2
STAPLER RELOAD 3.5X60 BLUE (STAPLE) ×2
SUT ETHIBOND 0 36 GRN (SUTURE) IMPLANT
SUT MNCRL AB 4-0 PS2 18 (SUTURE) ×4 IMPLANT
SUT VICRYL 0 TIES 12 18 (SUTURE) ×2 IMPLANT
SYR 10ML ECCENTRIC (SYRINGE) IMPLANT
SYR 20ML LL LF (SYRINGE) ×2 IMPLANT
TOWEL OR 17X26 10 PK STRL BLUE (TOWEL DISPOSABLE) ×2 IMPLANT
TRAY FOLEY MTR SLVR 16FR STAT (SET/KITS/TRAYS/PACK) IMPLANT
TROCAR ADV FIXATION 5X100MM (TROCAR) IMPLANT
TROCAR BLADELESS OPT 5 100 (ENDOMECHANICALS) ×2 IMPLANT
TUBE CALIBRATION LAPBAND (TUBING) IMPLANT
TUBING INSUFFLATION 10FT LAP (TUBING) ×2 IMPLANT

## 2022-03-08 NOTE — Progress Notes (Signed)
PHARMACY CONSULT FOR:  Risk Assessment for Post-Discharge VTE Following Bariatric Surgery ? ?Post-Discharge VTE Risk Assessment: ?This patient's probability of 30-day post-discharge VTE is increased due to the factors marked: ?x Sleeve gastrectomy  ? Liver disorder (transplant, cirrhosis, or nonalcoholic steatohepatitis)  ? Hx of VTE  ? Hemorrhage requiring transfusion  ? GI perforation, leak, or obstruction  ? ====================================================  ?  Female  ?  Age >/=60 years  ?x  BMI >/=50 kg/m2  ?  CHF  ?  Dyspnea at Rest  ?  Paraplegia  ?  Non-gastric-band surgery  ?  Operation Time >/=3 hr  ?  Return to OR   ?  Length of Stay >/= 3 d  ? Hypercoagulable condition  ? Significant venous stasis  ? ? ? ? ?Predicted probability of 30-day post-discharge VTE: 0.27% ? ?Other patient-specific factors to consider: ? ? ?Recommendation for Discharge: ?No pharmacologic prophylaxis post-discharge ? ? ?Crystal Compton is a 36 y.o. female who underwent robotic sleeve gastrectomy and upper endoscopy on 03/08/22 ?  ?Case start: 1409 ?Case end: 1600 ? ? ?No Known Allergies ? ?Patient Measurements: ?Height: 5\' 6"  (167.6 cm) ?Weight: (!) 145.2 kg (320 lb 1.7 oz) ?IBW/kg (Calculated) : 59.3 ?Body mass index is 51.67 kg/m?. ? ?Recent Labs  ?  03/08/22 ?1249  ?CREATININE 0.60  ?ALBUMIN 3.7  ?PROT 7.0  ?AST 31  ?ALT 31  ?ALKPHOS 35*  ?BILITOT 0.5  ? ?Estimated Creatinine Clearance: 143.8 mL/min (by C-G formula based on SCr of 0.6 mg/dL). ? ? ? ?Past Medical History:  ?Diagnosis Date  ? Anxiety   ? Cholelithiasis   ? Depression   ? Hyperlipidemia   ? PE (pulmonary embolism)   ? Pre-diabetes   ? Prediabetes   ? ? ? ?Medications Prior to Admission  ?Medication Sig Dispense Refill Last Dose  ? metroNIDAZOLE (FLAGYL) 500 MG tablet Take 500 mg by mouth daily.   complete  ? Vitamin D, Ergocalciferol, (DRISDOL) 1.25 MG (50000 UNIT) CAPS capsule Take 50,000 Units by mouth once a week.   Past Week  ? methocarbamol (ROBAXIN) 500 MG  tablet Take 1 tablet (500 mg total) by mouth every 8 (eight) hours as needed for muscle spasms. (Patient not taking: Reported on 02/24/2022) 20 tablet 0 Not Taking  ? methocarbamol (ROBAXIN) 500 MG tablet Take 1 tablet (500 mg total) by mouth 2 (two) times daily. (Patient not taking: Reported on 02/24/2022) 20 tablet 0 Not Taking  ? naproxen (NAPROSYN) 500 MG tablet Take 1 tablet (500 mg total) by mouth 2 (two) times daily as needed. 30 tablet 0 More than a month  ? sertraline (ZOLOFT) 100 MG tablet Take 1 tablet (100 mg total) by mouth daily. (Patient not taking: Reported on 02/24/2022) 30 tablet 6 Not Taking  ? valACYclovir (VALTREX) 500 MG tablet Take 500 mg by mouth daily.   More than a month  ? ? ? ?04/26/2022, PharmD, BCPS ?Pharmacy: [facility] ?03/08/2022,4:12 PM ? ?

## 2022-03-08 NOTE — Transfer of Care (Signed)
Immediate Anesthesia Transfer of Care Note ? ?Patient: Crystal Compton ? ?Procedure(s) Performed: ROBOTIC SLEEVE GASTRECTOMY ?UPPER GI ENDOSCOPY ? ?Patient Location: PACU ? ?Anesthesia Type:General ? ?Level of Consciousness: drowsy ? ?Airway & Oxygen Therapy: Patient Spontanous Breathing and Patient connected to face mask oxygen ? ?Post-op Assessment: Report given to RN and Post -op Vital signs reviewed and stable ? ?Post vital signs: Reviewed and stable ? ?Last Vitals:  ?Vitals Value Taken Time  ?BP 188/109 03/08/22 1615  ?Temp    ?Pulse 82 03/08/22 1616  ?Resp 23 03/08/22 1616  ?SpO2 100 % 03/08/22 1616  ?Vitals shown include unvalidated device data. ? ?Last Pain:  ?Vitals:  ? 03/08/22 1237  ?TempSrc:   ?PainSc: 0-No pain  ?   ? ?  ? ?Complications: No notable events documented. ?

## 2022-03-08 NOTE — Anesthesia Procedure Notes (Signed)
Procedure Name: Intubation ?Date/Time: 03/08/2022 1:40 PM ?Performed by: Garrel Ridgel, CRNA ?Pre-anesthesia Checklist: Patient identified, Emergency Drugs available, Suction available and Patient being monitored ?Patient Re-evaluated:Patient Re-evaluated prior to induction ?Oxygen Delivery Method: Circle system utilized ?Preoxygenation: Pre-oxygenation with 100% oxygen ?Induction Type: IV induction ?Ventilation: Mask ventilation without difficulty ?Laryngoscope Size: Mac and 4 ?Grade View: Grade II ?Tube type: Oral ?Tube size: 7.0 mm ?Number of attempts: 1 ?Airway Equipment and Method: Stylet and Oral airway ?Placement Confirmation: ETT inserted through vocal cords under direct vision, positive ETCO2 and breath sounds checked- equal and bilateral ?Secured at: 23 cm ?Tube secured with: Tape ?Dental Injury: Teeth and Oropharynx as per pre-operative assessment  ? ? ? ? ?

## 2022-03-08 NOTE — Op Note (Signed)
? ?  Surgeon: Wenda Low, MD, FACS ? ?Asst:  Phylliss Blakes, MD, FACS ?08 Mar 2022 ?Anes:  General endotracheal ? ?Procedure: Robotic sleeve gastrectomy and upper endoscopy ? ?Diagnosis: Morbid obesity ? ?Complications: None noted ? ?EBL:   15 cc ? ?Description of Procedure: ? The patient was take to OR 3 and given general anesthesia.  The abdomen was prepped with Chloroprep and draped sterilely.  A timeout was performed.  Access to the abdomen was achieved with 5 mm Optiview throuogh the left upper quadrant. ? ?Following insufflation, the state of the abdomen was found to be free of adhesions.  The ViSiGi 36Fr tube was inserted to deflate the stomach and was pulled back into the esophagus.  Four trocars were placed including a 12 mm for the robotic stapler.   ? ?The pylorus was identified and we measured 5 cm back and marked the antrum.  At that point we began dissection to take down the greater curvature of the stomach using the vessel sealer.  This dissection was taken all the way up to the left crus.  Posterior attachments of the stomach were also taken down.  The upper stomach was very tightly adherent to the spleen then this was dissected free.  ? ?The ViSiGi tube was then passed into the antrum and suction applied so that it was snug along the lessor curvature.  The "crow's foot" or incisura was identified.  The sleeve gastrectomy was begun using the Sureform platform stapler beginning with a two blue cartridges and then white load to complete the resection.. ? ?When the sleeve was complete the tube was taken off suction and insufflated briefly.  It was cylindrical with a slight taper at the Crow's foot.  The tube was withdrawn.  Upper endoscopy was then performed by me and no Schazki's ring was seen, no hiatal hernia and a cylindrical sleeve was seen.  .   ? ? The specimen was extracted through the 15 trocar site.  This 15 was closed with a single 0 vicryl with a PMI.  Local block was provided by  infiltrating abdomen as a TAP block and then closed 4-0 Monocryl and Dermabond.   ? ?Matt B. Daphine Deutscher, MD, FACS ?[facility], [place] ?[phone]  ?

## 2022-03-08 NOTE — Discharge Instructions (Signed)
GASTRIC BYPASS / SLEEVE  ?Home Care Instructions ? ?These instructions are to help you care for yourself when you go home. ? ?Call: If you have any problems. ?Call [phone] and ask for the surgeon on call ?If you have an emergency related to your surgery please use the ER at [place].  ?Tell the ER staff that you are a new post-op gastric bypass or gastric sleeve patient ?  ?Signs and symptoms to report: Severe vomiting or nausea ?If you cannot handle clear liquids for longer than 1 day, call your surgeon  ?Abdominal pain which does not get better after taking your pain medication ?Fever greater than 100.4? F and chills ?Heart rate over 100 beats a minute ?Trouble breathing ?Chest pain ? Redness, swelling, drainage, or foul odor at incision (surgical) sites ? If your incisions open or pull apart ?Swelling or pain in calf (lower leg) ?Diarrhea (Loose bowel movements that happen often), frequent watery, uncontrolled bowel movements ?Constipation, (no bowel movements for 3 days) if this happens:  ?Take Milk of Magnesia, 2 tablespoons by mouth, 3 times a day for 2 days if needed ?Stop taking Milk of Magnesia once you have had a bowel movement ?Call your doctor if constipation continues ?Or ?Take Miralax  (instead of Milk of Magnesia) following the label instructions ?Stop taking Miralax once you have had a bowel movement ?Call your doctor if constipation continues ?Anything you think is ?abnormal for you? ?  ?Normal side effects after surgery: Unable to sleep at night or unable to concentrate ?Irritability ?Being tearful (crying) or depressed ?These are common complaints, possibly related to your anesthesia, stress of surgery and change in lifestyle, that usually go away a few weeks after surgery.  If these feelings continue, call your medical doctor.  ?Wound Care: You may have surgical glue, steri-strips, or staples over your incisions after surgery ?Surgical glue:  Looks like a clear film over your incisions  and will wear off a little at a time ?Steri-strips : Adhesive strips of tape over your incisions. You may notice a yellowish color on the skin under the steri-strips. This is used to make the   steri-strips stick better. Do not pull the steri-strips off - let them fall off ?Staples: Staples may be removed before you leave the hospital ?If you go home with staples, call [facility] at for an appointment with your surgeon?s nurse to have staples removed 10 days after surgery, [phone] ?Showering: You may shower two (2) days after your surgery unless your surgeon tells you differently ?Wash gently around incisions with warm soapy water, rinse well, and gently pat dry  ?If you have a drain (tube from your incision), you may need someone to hold this while you shower  ?No tub baths until staples are removed and incisions are healed   ?  ?Medications: Medications should be liquid or crushed if larger than the size of a dime ?Extended release pills (medication that releases a little bit at a time through the day) should not be crushed ?Depending on the size and number of medications you take, you may need to space (take a few throughout the day)/change the time you take your medications so that you do not over-fill your pouch (smaller stomach) ?Make sure you follow-up with your primary care physician to make medication changes needed during rapid weight loss and life-style changes ?If you have diabetes, follow up with the doctor that orders your diabetes medication(s) within one week after surgery and check   your blood sugar regularly. ?Do not drive while taking narcotics (pain medications) ?DO NOT take NSAID'S (Examples of NSAID's include ibuprofen, naproxen)  ?Diet:                    First 2 Weeks ? You will see the nutritionist about two (2) weeks after your surgery. The nutritionist will increase the types of foods you can eat if you are handling liquids well: ?If you have severe vomiting or nausea  and cannot handle clear liquids lasting longer than 1 day, call your surgeon  ?Protein Shake ?Drink at least 2 ounces of shake 5-6 times per day ?Each serving of protein shakes (usually 8 - 12 ounces) should have a minimum of:  ?15 grams of protein  ?And no more than 5 grams of carbohydrate  ?Goal for protein each day: ?Men = 80 grams per day ?Women = 60 grams per day ?Protein powder may be added to fluids such as non-fat milk or Lactaid milk or Soy milk (limit to 35 grams added protein powder per serving) ? ?Hydration ?Slowly increase the amount of water and other clear liquids as tolerated (See Acceptable Fluids) ?Slowly increase the amount of protein shake as tolerated  ? Sip fluids slowly and throughout the day ?May use sugar substitutes in small amounts (no more than 6 - 8 packets per day; i.e. Splenda) ? ?Fluid Goal ?The first goal is to drink at least 8 ounces of protein shake/drink per day (or as directed by the nutritionist);  See handout from pre-op Bariatric Education Class for examples of protein shake/drink.   ?Slowly increase the amount of protein shake you drink as tolerated ?You may find it easier to slowly sip shakes throughout the day ?It is important to get your proteins in first ?Your fluid goal is to drink 64 - 100 ounces of fluid daily ?It may take a few weeks to build up to this ?32 oz (or more) should be clear liquids  ?And  ?32 oz (or more) should be full liquids (see below for examples) ?Liquids should not contain sugar, caffeine, or carbonation ? ?Clear Liquids: ?Water or Sugar-free flavored water (i.e. Fruit H2O, Propel) ?Decaffeinated coffee or tea (sugar-free) ?Crystal Compton Lite, Wyler?s Lite, Minute Maid Lite ?Sugar-free Jell-O ?Bouillon or broth ?Sugar-free Popsicle:   *Less than 20 calories each; Limit 1 per day ? ?Full Liquids: ?Protein Shakes/Drinks + 2 choices per day of other full liquids ?Full liquids must be: ?No More Than 12 grams of Carbs per serving  ?No More Than 3 grams of Fat  per serving ?Strained low-fat cream soup ?Non-Fat milk ?Fat-free Lactaid Milk ?Sugar-free yogurt (Dannon Lite & Fit, Greek yogurt) ? ? ? ?  ?Vitamins and Minerals Start 1 day after surgery unless otherwise directed by your surgeon ?Bariatric Specific Complete Multivitamins ?Chewable Calcium Citrate with Vitamin D-3 ?(Example: 3 Chewable Calcium Plus 600 with Vitamin D-3) ?Take 500 mg three (3) times a day for a total of 1500 mg each day ?Do not take all 3 doses of calcium at one time as it may cause constipation, and you can only absorb 500 mg  at a time  ?Do not mix multivitamins containing iron with calcium supplements; take 2 hours apart ? ?Menstruating women and those at risk for anemia (a blood disease that causes weakness) may need extra iron ?Talk with your doctor to see if you need more iron ?If you need extra iron: Total daily Iron recommendation (including Vitamins) is 50 to 100   mg Iron/day ?Do not stop taking or change any vitamins or minerals until you talk to your nutritionist or surgeon ?Your nutritionist and/or surgeon must approve all vitamin and mineral supplements ?  ?Activity and Exercise: It is important to continue walking at home.  Limit your physical activity as instructed by your doctor.  During this time, use these guidelines: ?Do not lift anything greater than ten (10) pounds for at least two (2) weeks ?Do not go back to work or drive until your surgeon says you can ?You may have sex when you feel comfortable  ?It is VERY important for female patients to use a reliable birth control method; fertility often increases after surgery  ?Do not get pregnant for at least 18 months ?Start exercising as soon as your doctor tells you that you can ?Make sure your doctor approves any physical activity ?Start with a simple walking program ?Walk 5-15 minutes each day, 7 days per week.  ?Slowly increase until you are walking 30-45 minutes per day ?Consider joining our BELT program. [phone] or email  [email or web address] ?  ?Special Instructions Things to remember: ? ?Use your CPAP when sleeping if this applies to you, do not stop the use of CPAP unless directed by physician after a sleep study ?[place]

## 2022-03-08 NOTE — Anesthesia Preprocedure Evaluation (Addendum)
Anesthesia Evaluation  ?Patient identified by MRN, date of birth, ID band ?Patient awake ? ? ? ?Reviewed: ?Allergy & Precautions, NPO status , Patient's Chart, lab work & pertinent test results ? ?Airway ?Mallampati: II ? ?TM Distance: >3 FB ?Neck ROM: Full ? ? ? Dental ?no notable dental hx. ? ?  ?Pulmonary ?neg pulmonary ROS, former smoker,  ?  ?Pulmonary exam normal ?breath sounds clear to auscultation ? ? ? ? ? ? Cardiovascular ?Exercise Tolerance: Good ?+ DVT (h/o PE resolved on anticoagulation and has not recurred)  ? ?Rhythm:Regular Rate:Normal ? ?Normal sinus rhythm ?Possible Anterior infarct , age undetermined ?Abnormal ECG ?When compared with ECG of 08-Feb-2021 10:53, ?PREVIOUS ECG IS PRESENTpossible anterior infarct is new ?Confirmed by Tonny Bollman [phone]) on 03/03/2022 11:43:57 PM ?  ?Neuro/Psych ?PSYCHIATRIC DISORDERS Anxiety Depression negative neurological ROS ?   ? GI/Hepatic ?negative GI ROS, Neg liver ROS,   ?Endo/Other  ?Morbid obesityHyperlipidemia ?Pre diabetes ? Renal/GU ?negative Renal ROS  ?negative genitourinary ?  ?Musculoskeletal ?negative musculoskeletal ROS ?(+)  ? Abdominal ?  ?Peds ?negative pediatric ROS ?(+)  Hematology ?negative hematology ROS ?(+)   ?Anesthesia Other Findings ? ? Reproductive/Obstetrics ?negative OB ROS ? ?  ? ? ? ? ? ? ? ? ? ? ? ? ? ?  ?  ? ? ? ? ? ? ? ?Anesthesia Physical ?Anesthesia Plan ? ?ASA: 4 ? ?Anesthesia Plan: General  ? ?Post-op Pain Management: Minimal or no pain anticipated and Tylenol PO (pre-op)*  ? ?Induction: Intravenous ? ?PONV Risk Score and Plan: Treatment may vary due to age or medical condition, Ondansetron, Dexamethasone, Midazolam and Scopolamine patch - Pre-op ? ?Airway Management Planned: Oral ETT ? ?Additional Equipment: None ? ?Intra-op Plan:  ? ?Post-operative Plan: Extubation in OR ? ?Informed Consent: I have reviewed the patients History and Physical, chart, labs and discussed the procedure  including the risks, benefits and alternatives for the proposed anesthesia with the patient or authorized representative who has indicated his/her understanding and acceptance.  ? ? ? ?Dental advisory given ? ?Plan Discussed with: CRNA, Anesthesiologist and Surgeon ? ?Anesthesia Plan Comments:   ? ? ? ? ? ?Anesthesia Quick Evaluation ? ?

## 2022-03-08 NOTE — Interval H&P Note (Signed)
History and Physical Interval Note: ? ?03/08/2022 ?12:59 PM ? ?Crystal Compton  has presented today for surgery, with the diagnosis of MORBID OBESITY.  The various methods of treatment have been discussed with the patient and family. After consideration of risks, benefits and other options for treatment, the patient has consented to  Procedure(s): ?ROBOTIC SLEEVE GASTRECTOMY (N/A) ?UPPER GI ENDOSCOPY (N/A) as a surgical intervention.  The patient's history has been reviewed, patient examined, no change in status, stable for surgery.  I have reviewed the patient's chart and labs.  Questions were answered to the patient's satisfaction.   ? ? ?Valarie Merino ? ? ?

## 2022-03-08 NOTE — Progress Notes (Signed)
Patient walked  to the chair from the stretcher upon arrival to the floor. Pt was given yellow worksheet and IS  was explained to her. Pt was nauseated and started vomiting and is unable to try IS at this time.  ?

## 2022-03-08 NOTE — Progress Notes (Signed)
Patient seen in Short Stay prior to surgery.  Discussed QI "Goals for Discharge" document with patient including ambulation in halls, Incentive Spirometry use every hour, and oral care.  Also discussed pain and nausea control.  BSTOP education provided including BSTOP information guide, "Guide for Pain Management after your Bariatric Procedure".  Diet progression education provided including "Bariatric Surgery Post-Op Food Plan Phase 1: Liquids".  Questions answered.  Will continue to partner with bedside RN and follow up with patient per protocol after surgery complete.  

## 2022-03-08 NOTE — Anesthesia Postprocedure Evaluation (Signed)
Anesthesia Post Note ? ?Patient: Crystal Compton ? ?Procedure(s) Performed: ROBOTIC SLEEVE GASTRECTOMY ?UPPER GI ENDOSCOPY ? ?  ? ?Patient location during evaluation: PACU ?Anesthesia Type: General ?Level of consciousness: awake and alert ?Pain management: pain level controlled ?Vital Signs Assessment: post-procedure vital signs reviewed and stable ?Respiratory status: spontaneous breathing, nonlabored ventilation and respiratory function stable ?Cardiovascular status: blood pressure returned to baseline and stable ?Postop Assessment: no apparent nausea or vomiting ?Anesthetic complications: no ? ? ?No notable events documented. ? ?Last Vitals:  ?Vitals:  ? 03/08/22 1635 03/08/22 1645  ?BP:  (!) 172/100  ?Pulse:  79  ?Resp:  20  ?Temp:    ?SpO2: 100% 99%  ?  ?Last Pain:  ?Vitals:  ? 03/08/22 1645  ?TempSrc:   ?PainSc: Asleep  ? ? ?  ?  ?  ?  ?  ?  ? ?Mellody Dance ? ? ? ? ?

## 2022-03-09 ENCOUNTER — Other Ambulatory Visit (HOSPITAL_COMMUNITY): Payer: Self-pay

## 2022-03-09 ENCOUNTER — Encounter (HOSPITAL_COMMUNITY): Payer: Self-pay | Admitting: Surgery

## 2022-03-09 LAB — CBC WITH DIFFERENTIAL/PLATELET
Abs Immature Granulocytes: 0.04 10*3/uL (ref 0.00–0.07)
Basophils Absolute: 0 10*3/uL (ref 0.0–0.1)
Basophils Relative: 0 %
Eosinophils Absolute: 0 10*3/uL (ref 0.0–0.5)
Eosinophils Relative: 0 %
HCT: 34.3 % — ABNORMAL LOW (ref 36.0–46.0)
Hemoglobin: 10.9 g/dL — ABNORMAL LOW (ref 12.0–15.0)
Immature Granulocytes: 0 %
Lymphocytes Relative: 15 %
Lymphs Abs: 1.5 10*3/uL (ref 0.7–4.0)
MCH: 24.4 pg — ABNORMAL LOW (ref 26.0–34.0)
MCHC: 31.8 g/dL (ref 30.0–36.0)
MCV: 76.9 fL — ABNORMAL LOW (ref 80.0–100.0)
Monocytes Absolute: 0.5 10*3/uL (ref 0.1–1.0)
Monocytes Relative: 5 %
Neutro Abs: 8 10*3/uL — ABNORMAL HIGH (ref 1.7–7.7)
Neutrophils Relative %: 80 %
Platelets: 285 10*3/uL (ref 150–400)
RBC: 4.46 MIL/uL (ref 3.87–5.11)
RDW: 14.1 % (ref 11.5–15.5)
WBC: 10.3 10*3/uL (ref 4.0–10.5)
nRBC: 0 % (ref 0.0–0.2)

## 2022-03-09 MED ORDER — PANTOPRAZOLE SODIUM 40 MG PO TBEC
40.0000 mg | DELAYED_RELEASE_TABLET | Freq: Every day | ORAL | 0 refills | Status: DC
  Filled 2022-03-09: qty 30, 30d supply, fill #0

## 2022-03-09 MED ORDER — OXYCODONE HCL 5 MG PO TABS
5.0000 mg | ORAL_TABLET | Freq: Four times a day (QID) | ORAL | 0 refills | Status: DC | PRN
  Filled 2022-03-09: qty 10, 3d supply, fill #0

## 2022-03-09 MED ORDER — ENOXAPARIN SODIUM 60 MG/0.6ML IJ SOSY
60.0000 mg | PREFILLED_SYRINGE | Freq: Two times a day (BID) | INTRAMUSCULAR | 0 refills | Status: AC
  Filled 2022-03-09: qty 8.4, 7d supply, fill #0
  Filled 2022-03-09: qty 25.2, 21d supply, fill #0

## 2022-03-09 MED ORDER — ONDANSETRON 4 MG PO TBDP
4.0000 mg | ORAL_TABLET | Freq: Four times a day (QID) | ORAL | 0 refills | Status: DC | PRN
  Filled 2022-03-09: qty 20, 5d supply, fill #0

## 2022-03-09 MED ORDER — ENOXAPARIN (LOVENOX) PATIENT EDUCATION KIT
PACK | Freq: Once | Status: AC
  Filled 2022-03-09: qty 1

## 2022-03-09 NOTE — Discharge Summary (Signed)
Physician Discharge Summary  ?Patient ID: ?Crystal Compton ?MRN: [id number] ?DOB/AGE: 12-16-1985 36 y.o. ? ?PCP: Felix Pacini, FNP ? ?Admit date: 03/08/2022 ?Discharge date: 03/09/2022 ? ?Admission Diagnoses:  obesity ? ?Discharge Diagnoses:  same  ?Principal Problem: ?  S/P laparoscopic sleeve gastrectomy ? ? ?Surgery:  robotic Xi sleeve gastectomy ? ?Discharged Condition: improved ? ?Hospital Course:   had robotic sleeve gastrectomy on Tuesday afternoon.  She was begun on liquids which she tolerated.  Pharm recommended discharge Lovenox because of her hx of prior PE ? ?Consults: pharmacy ? ?Significant Diagnostic Studies: none ? ? ? ?Discharge Exam: ?Blood pressure (!) 147/100, pulse 84, temperature 98.4 ?F (36.9 ?C), temperature source Oral, resp. rate 18, height 5\' 6"  (1.676 m), weight (!) 145.2 kg, last menstrual period 01/27/2022, SpO2 98 %, unknown if currently breastfeeding. ?Incisions OK. ? ?Disposition: Discharge disposition: 01-Home or Self Care ? ? ? ? ? ? ?Discharge Instructions   ? ? Ambulate hourly while awake   Complete by: As directed ?  ? Call MD for:  difficulty breathing, headache or visual disturbances   Complete by: As directed ?  ? Call MD for:  persistant dizziness or light-headedness   Complete by: As directed ?  ? Call MD for:  persistant nausea and vomiting   Complete by: As directed ?  ? Call MD for:  redness, tenderness, or signs of infection (pain, swelling, redness, odor or green/yellow discharge around incision site)   Complete by: As directed ?  ? Call MD for:  severe uncontrolled pain   Complete by: As directed ?  ? Call MD for:  temperature >101 F   Complete by: As directed ?  ? Diet bariatric full liquid   Complete by: As directed ?  ? Incentive spirometry   Complete by: As directed ?  ? Perform hourly while awake  ? ?  ? ?Allergies as of 03/09/2022   ?No Known Allergies ?  ? ?  ?Medication List  ?  ? ?TAKE these medications   ? ?enoxaparin 60 MG/0.6ML injection ?Commonly known  as: LOVENOX ?Inject 0.6 mLs (60 mg total) into the skin 2 (two) times daily for 28 days. ?  ?methocarbamol 500 MG tablet ?Commonly known as: ROBAXIN ?Take 1 tablet (500 mg total) by mouth every 8 (eight) hours as needed for muscle spasms. ?  ?methocarbamol 500 MG tablet ?Commonly known as: ROBAXIN ?Take 1 tablet (500 mg total) by mouth 2 (two) times daily. ?  ?metroNIDAZOLE 500 MG tablet ?Commonly known as: FLAGYL ?Take 500 mg by mouth daily. ?  ?naproxen 500 MG tablet ?Commonly known as: NAPROSYN ?Take 1 tablet (500 mg total) by mouth 2 (two) times daily as needed. ?Notes to patient: Avoid NSAIDs for 6-8 weeks after surgery ?  ?ondansetron 4 MG disintegrating tablet ?Commonly known as: ZOFRAN-ODT ?Take 1 tablet (4 mg total) by mouth every 6 (six) hours as needed for nausea or vomiting. ?  ?oxyCODONE 5 MG immediate release tablet ?Commonly known as: Oxy IR/ROXICODONE ?Take 1 tablet (5 mg total) by mouth every 6 (six) hours as needed for severe pain. ?  ?pantoprazole 40 MG tablet ?Commonly known as: PROTONIX ?Take 1 tablet (40 mg total) by mouth daily. ?  ?sertraline 100 MG tablet ?Commonly known as: Zoloft ?Take 1 tablet (100 mg total) by mouth daily. ?  ?valACYclovir 500 MG tablet ?Commonly known as: VALTREX ?Take 500 mg by mouth daily. ?  ?Vitamin D (Ergocalciferol) 1.25 MG (50000 UNIT) Caps capsule ?Commonly known as:  DRISDOL ?Take 50,000 Units by mouth once a week. ?  ? ?  ? ? Follow-up Information   ? ? Luretha Murphy, MD. Nyra Capes on 03/30/2022.   ?Specialty: General Surgery ?Why: at 3:15pm in the [facility] OFFICE.  Please arrive 15 minutes prior to your appointment time.  Thank you. ?Contact information: ?[address] ?[address] ?[place] [address] ?[phone] ? ? ?  ?  ? ? Luretha Murphy, MD. Go on 05/04/2022.   ?Specialty: General Surgery ?Why: at 9:15am in the [facility] OFFICE.  Please arrive 15 minutes prior to your appointment time.  Thank you. ?Contact information: ?[address] ?[address] ?[place]  [place] [address] ?[phone] ? ? ?  ?  ? ?  ?  ? ?  ? ? ?Signed: ?Valarie Merino ?03/09/2022, 4:16 PM ?  ? ?

## 2022-03-09 NOTE — Plan of Care (Signed)
  Problem: Education: Goal: Ability to state signs and symptoms to report to health care provider will improve Outcome: Adequate for Discharge Goal: Knowledge of the prescribed self-care regimen will improve Outcome: Adequate for Discharge Goal: Knowledge of discharge needs will improve Outcome: Adequate for Discharge   Problem: Activity: Goal: Ability to tolerate increased activity will improve Outcome: Adequate for Discharge   Problem: Bowel/Gastric: Goal: Gastrointestinal status for postoperative course will improve Outcome: Adequate for Discharge Goal: Occurrences of nausea will decrease Outcome: Adequate for Discharge   Problem: Coping: Goal: Development of coping mechanisms to deal with changes in body function or appearance will improve Outcome: Adequate for Discharge   Problem: Fluid Volume: Goal: Maintenance of adequate hydration will improve Outcome: Adequate for Discharge   Problem: Nutritional: Goal: Nutritional status will improve Outcome: Adequate for Discharge   Problem: Clinical Measurements: Goal: Will show no signs or symptoms of venous thromboembolism Outcome: Adequate for Discharge Goal: Will remain free from infection Outcome: Adequate for Discharge Goal: Will show no signs of GI Leak Outcome: Adequate for Discharge   Problem: Respiratory: Goal: Will regain and/or maintain adequate ventilation Outcome: Adequate for Discharge   Problem: Pain Management: Goal: Pain level will decrease Outcome: Adequate for Discharge   Problem: Skin Integrity: Goal: Demonstration of wound healing without infection will improve Outcome: Adequate for Discharge   

## 2022-03-09 NOTE — Progress Notes (Addendum)
PHARMACY CONSULT FOR:  Risk Assessment for Post-Discharge VTE Following Bariatric Surgery ? ?Post-Discharge VTE Risk Assessment: ?This patient's probability of 30-day post-discharge VTE is increased due to the factors marked: ?x Sleeve gastrectomy  ? Liver disorder (transplant, cirrhosis, or nonalcoholic steatohepatitis)  ?x Hx of VTE  ? Hemorrhage requiring transfusion  ? GI perforation, leak, or obstruction  ? ====================================================  ?  Female  ?  Age >/=60 years  ?x  BMI >/=50 kg/m2  ?  CHF  ?  Dyspnea at Rest  ?  Paraplegia  ?  Non-gastric-band surgery  ?  Operation Time >/=3 hr  ?  Return to OR   ?  Length of Stay >/= 3 d  ? Hypercoagulable condition  ? Significant venous stasis  ? ? ? ? ?Predicted probability of 30-day post-discharge VTE:  ? ?Other patient-specific factors to consider: Notified by London Pepper, RN today that patient has a hx of bilateral PEs in 2010, therefore, she now qualifies for post-discharge VTE prophylaxis. ? ? ?Recommendation for Discharge: ?Lovenox 60mg  SQ q12h x 4 weeks ? ? ?Crystal Compton is a 36 y.o. female who underwent robotic sleeve gastrectomy and upper endoscopy on 03/08/22 ?  ?Case start: 1409 ?Case end: 1600 ? ? ?No Known Allergies ? ?Patient Measurements: ?Height: 5\' 6"  (167.6 cm) ?Weight: (!) 145.2 kg (320 lb 1.7 oz) ?IBW/kg (Calculated) : 59.3 ?Body mass index is 51.67 kg/m?. ? ?Recent Labs  ?  03/08/22 ?1249 03/08/22 ?1808 03/08/22 ?03/10/22 03/09/22 ?9390  ?WBC  --  12.0*  --  10.3  ?HGB  --  12.0 11.6* 10.9*  ?HCT  --  38.4 37.4 34.3*  ?PLT  --  297  --  285  ?CREATININE 0.60 0.74  --   --   ?ALBUMIN 3.7  --   --   --   ?PROT 7.0  --   --   --   ?AST 31  --   --   --   ?ALT 31  --   --   --   ?ALKPHOS 35*  --   --   --   ?BILITOT 0.5  --   --   --   ? ? ?Estimated Creatinine Clearance: 143.8 mL/min (by C-G formula based on SCr of 0.74 mg/dL). ? ? ? ?Past Medical History:  ?Diagnosis Date  ? Anxiety   ? Cholelithiasis   ? Depression   ?  Hyperlipidemia   ? PE (pulmonary embolism)   ? Pre-diabetes   ? Prediabetes   ? ? ? ?Medications Prior to Admission  ?Medication Sig Dispense Refill Last Dose  ? metroNIDAZOLE (FLAGYL) 500 MG tablet Take 500 mg by mouth daily.   complete  ? Vitamin D, Ergocalciferol, (DRISDOL) 1.25 MG (50000 UNIT) CAPS capsule Take 50,000 Units by mouth once a week.   Past Week  ? methocarbamol (ROBAXIN) 500 MG tablet Take 1 tablet (500 mg total) by mouth every 8 (eight) hours as needed for muscle spasms. (Patient not taking: Reported on 02/24/2022) 20 tablet 0 Not Taking  ? methocarbamol (ROBAXIN) 500 MG tablet Take 1 tablet (500 mg total) by mouth 2 (two) times daily. (Patient not taking: Reported on 02/24/2022) 20 tablet 0 Not Taking  ? naproxen (NAPROSYN) 500 MG tablet Take 1 tablet (500 mg total) by mouth 2 (two) times daily as needed. 30 tablet 0 More than a month  ? sertraline (ZOLOFT) 100 MG tablet Take 1 tablet (100 mg total) by mouth daily. (Patient not taking: Reported on  02/24/2022) 30 tablet 6 Not Taking  ? valACYclovir (VALTREX) 500 MG tablet Take 500 mg by mouth daily.   More than a month  ? ? ? ?Loralee Pacas, PharmD, BCPS ?Pharmacy: [phone] ?03/09/2022,1:45 PM ? ?

## 2022-03-09 NOTE — Progress Notes (Signed)
Patient alert and oriented, pain is controlled. Patient is tolerating fluids, advanced to protein shake today, patient is tolerating well.  Reviewed Gastric sleeve discharge instructions with patient and patient is able to articulate understanding.  Provided information on BELT program, Support Group and [facility] outpatient pharmacy. All questions answered, will continue to monitor.  

## 2022-03-10 ENCOUNTER — Other Ambulatory Visit (HOSPITAL_COMMUNITY): Payer: Self-pay

## 2022-03-10 ENCOUNTER — Telehealth (HOSPITAL_COMMUNITY): Payer: Self-pay | Admitting: *Deleted

## 2022-03-10 LAB — SURGICAL PATHOLOGY

## 2022-03-10 NOTE — Telephone Encounter (Signed)
Pt called with concerns of intermittent abdominal cramping.  We discussed sipping slowly and avoiding cold liquids.  Pt denies nausea and/or vomiting and is able to tolerate fluids.  Discussed with patient to take pain medication as needed and that acetaminophen is also an option to take for discomfort.  Pt is also using a heating pad.  Will continue to monitor as needed.

## 2022-03-11 ENCOUNTER — Other Ambulatory Visit (HOSPITAL_COMMUNITY): Payer: Self-pay

## 2022-03-11 ENCOUNTER — Telehealth (HOSPITAL_COMMUNITY): Payer: Self-pay | Admitting: *Deleted

## 2022-03-11 NOTE — Telephone Encounter (Signed)
1.  Tell me about your pain and pain management? Pt states that she has been experiencing some intermittent abdominal cramping while drinking that lasts for a few seconds and then "goes away".  Pt states that "you can just feel it going down".  Pt can tolerate protein shakes and water.  Pt instructed to call [facility] if pain worsens.  2.  Let's talk about fluid intake.  How much total fluid are you taking in? Pt states that she is working to meet goal of 64 oz of fluid today.  Pt has been able to consume approx. 40 oz of fluid per day since surgery.  Pt plans to increase clear liquids to meet fluid goals.  Pt instructed to assess status and suggestions daily utilizing Hydration Action Plan on discharge folder and to call [facility] if in the "red zone".   3.  How much protein have you taken in the last 2 days? Pt states that she is working to meet goal of goal of 60g of protein today.  Pt has already consumed one protein shake.  Discussed with pt that she will need to drink two protein shakes daily to meet goal. Pt plans to drink remainder of protein throughout the rest of the day to meet goal.  4.  Have you had nausea?  Tell me about when have experienced nausea and what you did to help? Pt denies nausea.   5.  Has the frequency or color changed with your urine? Pt states that she is urinating "fine" with no changes in frequency or urgency.     6.  Tell me what your incisions look like? "Incisions look fine". Pt denies a fever, chills.  Pt states incisions are not swollen, open, or draining.  Pt encouraged to call [facility] if incisions change.   7.  Have you been passing gas? BM? Pt states that she has not had a BM.  Pt instructed to take either Miralax or MoM as instructed per "Gastric Bypass/Sleeve Discharge Home Care Instructions".  Pt to call surgeon's office if not able to have BM with medication.   8.  If a problem or question were to arise who would you call?  Do you know contact numbers for [facility], [facility],  and [facility]? Pt denies dehydration symptoms.  Pt can describe s/sx of dehydration.  Pt knows to call [facility] for surgical, [facility] for nutrition, and [facility] for non-urgent questions or concerns.   9.  How has the walking going? Pt states she is walking around and able to be active without difficulty.   10. Are you still using your incentive spirometer?  If so, how often? Pt states that she is doing the I.S. once daily.   Pt encouraged to use incentive spirometer, at least 10x every hour while awake until she sees the surgeon.  11.  How are your vitamins and calcium going?  How are you taking them? Pt states that she is taking her supplements and vitamins without difficulty.  LOVENOX: Pt states that she is taking the Lovenox injections without difficulty.  Reinforced education about taking injections q12h and rotating injection sites.  Pt also instructed to monitor for unusual bruising and/or signs of bleeding.  Reminded patient that the first 30 days post-operatively are important for successful recovery.  Practice good hand hygiene, wearing a mask when appropriate (since optional in most places), and minimizing exposure to people who live outside of the home, especially if they are exhibiting any respiratory, GI, or illness-like symptoms.

## 2022-03-17 ENCOUNTER — Ambulatory Visit (INDEPENDENT_AMBULATORY_CARE_PROVIDER_SITE_OTHER): Payer: BC Managed Care – PPO | Admitting: Psychology

## 2022-03-17 DIAGNOSIS — F3289 Other specified depressive episodes: Secondary | ICD-10-CM | POA: Diagnosis not present
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
## 2022-03-22 ENCOUNTER — Encounter: Payer: BC Managed Care – PPO | Attending: Surgery | Admitting: Dietician

## 2022-03-22 ENCOUNTER — Encounter: Payer: Self-pay | Admitting: Dietician

## 2022-03-22 DIAGNOSIS — E669 Obesity, unspecified: Secondary | ICD-10-CM | POA: Diagnosis present

## 2022-03-22 NOTE — Progress Notes (Signed)
2 Week Post-Operative Nutrition Class   Patient was seen on 03/22/2022 for Post-Operative Nutrition education at the [facility].    Surgery date: 03/08/2022 Surgery type: Sleeve Gastrectomy  Anthropometrics  Start weight at [facility]: 320.5 lbs (date: 02/08/2021)  Weight: 300.0 pounds BMI: 46.99 kg/m2     Clinical  Medical hx: pulmonary embolism, anemia, gestational diabetes, prediabetes Medications:  One a day multivitamin, vitamin d supplement, zoloft Labs: HbA1c 5.7 (10/09/2019), vitamin D 22 NG/mL (03/06/2018) Notable signs/symptoms: none Any previous deficiencies? vitamin D and iron   Body Composition Scale 03/22/2022  Current Body Weight 300.0  Total Body Fat % 47.1  Visceral Fat 16  Fat-Free Mass % 52.8   Total Body Water % 40.9  Muscle-Mass lbs 35.5  BMI 46.7  Body Fat Displacement          Torso  lbs 87.6         Left Leg  lbs 17.5         Right Leg  lbs 17.5         Left Arm  lbs 8.7         Right Arm   lbs 8.7      The following the learning objectives were met by the patient during this course: Identifies Phase 3 (Soft, High Proteins) Dietary Goals and will begin from 2 weeks post-operatively to 2 months post-operatively Identifies appropriate sources of fluids and proteins  Identifies appropriate fat sources and healthy verses unhealthy fat types   States protein recommendations and appropriate sources post-operatively Identifies the need for appropriate texture modifications, mastication, and bite sizes when consuming solids Identifies appropriate fat consumption and sources Identifies appropriate multivitamin and calcium sources post-operatively Describes the need for physical activity post-operatively and will follow MD recommendations States when to call healthcare provider regarding medication questions or post-operative complications   Handouts given during class include: Phase 3A: Soft, High Protein Diet Handout Phase 3 High  Protein Meals Healthy Fats   Follow-Up Plan: Patient will follow-up at [facility] in 6 weeks for 2 month post-op nutrition visit for diet advancement per MD.

## 2022-03-28 ENCOUNTER — Encounter: Payer: BC Managed Care – PPO | Admitting: Psychology

## 2022-03-28 ENCOUNTER — Telehealth: Payer: Self-pay | Admitting: Dietician

## 2022-03-28 NOTE — Telephone Encounter (Signed)
RD called pt to verify fluid intake once starting soft, solid proteins 2 week post-bariatric surgery.   Daily Fluid intake: 64+ oz. Daily Protein intake: 60+ grams Bowel Habits: some constipation, states she is using Miralax   Concerns/issues:  Pt states with the solid proteins like chicken and fish, she gets full quick.  Pt states she is adding protein shakes to meet her needs.

## 2022-03-28 NOTE — Progress Notes (Signed)
This encounter was created in error - please disregard.

## 2022-03-30 ENCOUNTER — Other Ambulatory Visit (HOSPITAL_COMMUNITY): Payer: Self-pay

## 2022-03-31 ENCOUNTER — Other Ambulatory Visit (HOSPITAL_COMMUNITY): Payer: Self-pay

## 2022-04-15 ENCOUNTER — Ambulatory Visit (INDEPENDENT_AMBULATORY_CARE_PROVIDER_SITE_OTHER): Payer: BC Managed Care – PPO | Admitting: Psychology

## 2022-04-15 DIAGNOSIS — F3289 Other specified depressive episodes: Secondary | ICD-10-CM | POA: Diagnosis not present

## 2022-04-20 ENCOUNTER — Other Ambulatory Visit (HOSPITAL_COMMUNITY): Payer: Self-pay

## 2022-04-29 ENCOUNTER — Ambulatory Visit (INDEPENDENT_AMBULATORY_CARE_PROVIDER_SITE_OTHER): Payer: BC Managed Care – PPO | Admitting: Psychology

## 2022-04-29 DIAGNOSIS — F3289 Other specified depressive episodes: Secondary | ICD-10-CM

## 2022-04-29 NOTE — Progress Notes (Signed)
[facility] Counselor/Therapist Progress Note  Patient ID: Raksha Wolfgang, MRN: [id number]   Date: 04/29/22  Time Spent: 4:05  pm - 4:50 pm : 45 Minutes  Treatment Type: Individual Therapy.  Reported Symptoms: Depression  Mental Status Exam: Appearance:  Neat and Well Groomed     Behavior: Appropriate  Motor: Normal  Speech/Language:  Clear and Coherent  Affect: Congruent  Mood: depressed  Thought process: normal  Thought content:   WNL  Sensory/Perceptual disturbances:   WNL  Orientation: oriented to person  Attention: Good  Concentration: Good  Memory: WNL  Fund of knowledge:  Good  Insight:   Good  Judgment:  Good  Impulse Control: Good   Risk Assessment: Danger to Self:  No Self-injurious Behavior: No Danger to Others: No Duty to Warn:no Physical Aggression / Violence:No  Access to Firearms a concern: No  Gang Involvement:No   Subjective:   [name] participated from home, via video and consented to treatment. Therapist participated from home office. We met online due to [place] pandemic. Fifi reviewed the events of the past week. Shevawn noted her recent request for a letter to get time to work, from home, due to her mood. She has a new PCP, Lucky Cowboy, MSN, FNP-C, and was directed to her new provider for her request. She noted "clinging" more to family and noted having a good July 4th celebration. She noted continued anxiety. She noted her efforts to maintain boundaries with others. Therapist reviewed the results of the PCL screening, which was a 61, resulting in a "PTSD likely" screening. Clark will be provided with a list of local psychiatric providers to pursue a consult. Therapist provided a handout to track anxiety symptoms between sessions, to be processed during follow-ups. Therapist encouraged Laporchia to identify how she manages her anxiety in the past and the level of anxiety she experiences currently. She provided some examples  of her worry including worry about letting people down, discomfort around people she does not know, muscle tension, & social anxiety. Digna noted feeling "crazy".  She noted worrying "a lot" and what "people think of" her. Therapist provided psycho-education regarding anxiety and PTSD. We discussed the importance of mindfulness and discussed check-ins in the am and pm to aid in mindfulness and awareness of symptoms. Therapist modeled this during the session.   Adalina was engaged and motivated during the session. She expressed commitment towards our goals. Therapist praised Product manager and provided supportive therapy. Madisson was scheduled for follow-up appointments sand continues to benefit from counseling.   Interventions: Psycho-education & CBT  Diagnosis:  Other depression  Treatment Plan:  Client Abilities/Strengths Niang is self-aware and motivated for change.   Support System: Family  Client Treatment Preferences Outpatient therapy.   Client Statement of Needs Anely would like to "cope" with her anxiety and depression, increasing physical activity, reducing day-to-day stress, increase socialization, build relationships with family and friends, reduce rumination, manage symptoms on a day-to-day.     Treatment Level Weekly  Symptoms  Anxiety: Rumination, physiological tension, difficulty managing worry, worrying about different things, catastrophizing, panic symptoms, increased heart-rate, palpitations.     (Status: maintained) Depression: Sadness, feeling down,  lethargy, low motivation, insomnia and middle insomnia, decreased appetite when stressed, difficulty concentrating (fluctuates), denied SI. (Status: maintained)  Goals:   Cohen experiences symptoms of depression and anxiety.    Target Date: 04/16/23 Frequency: Weekly  Progress: 0 Modality: individual    Therapist will provide referrals for additional resources as appropriate.  Therapist will  provide  psycho-education regarding Isidra's diagnosis and corresponding treatment approaches and interventions. Licensed Clinical Social Worker, [place], LCSW will support the patient's ability to achieve the goals identified. will employ CBT, BA, Problem-solving, Solution Focused, Mindfulness,  coping skills, & other evidenced-based practices will be used to promote progress towards healthy functioning to help manage decrease symptoms associated with her diagnosis.   Reduce overall level, frequency, and intensity of the feelings of depression, anxiety and panic evidenced by decreased overall symptoms from 6 to 7 days/week to 0 to 1 days/week per client report for at least 3 consecutive months. Verbally express understanding of the relationship between feelings of depression, anxiety and their impact on thinking patterns and behaviors. Verbalize an understanding of the role that distorted thinking plays in creating fears, excessive worry, and ruminations.    [name] participated in the creation of the treatment plan)    Buena Irish, LCSW

## 2022-05-10 ENCOUNTER — Ambulatory Visit: Payer: BC Managed Care – PPO | Admitting: Skilled Nursing Facility1

## 2022-05-24 ENCOUNTER — Encounter: Payer: Self-pay | Admitting: Psychology

## 2022-05-24 NOTE — Progress Notes (Signed)
This encounter was created in error - please disregard.

## 2022-05-25 ENCOUNTER — Ambulatory Visit: Payer: BC Managed Care – PPO | Admitting: Skilled Nursing Facility1

## 2022-06-10 ENCOUNTER — Ambulatory Visit (INDEPENDENT_AMBULATORY_CARE_PROVIDER_SITE_OTHER): Payer: BC Managed Care – PPO | Admitting: Psychology

## 2022-06-10 DIAGNOSIS — F3289 Other specified depressive episodes: Secondary | ICD-10-CM | POA: Diagnosis not present

## 2022-06-10 NOTE — Progress Notes (Signed)
[facility] Counselor/Therapist Progress Note  Patient ID: Crystal Compton, MRN: [id number]   Date: 06/10/22  Time Spent: 5:05  pm - 6:00 pm : 55 Minutes  Treatment Type: Individual Therapy.  Reported Symptoms: Depression  Mental Status Exam: Appearance:  Neat and Well Groomed     Behavior: Appropriate  Motor: Normal  Speech/Language:  Clear and Coherent  Affect: Congruent  Mood: depressed  Thought process: normal  Thought content:   WNL  Sensory/Perceptual disturbances:   WNL  Orientation: oriented to person  Attention: Good  Concentration: Good  Memory: WNL  Fund of knowledge:  Good  Insight:   Good  Judgment:  Good  Impulse Control: Good   Risk Assessment: Danger to Self:  No Self-injurious Behavior: No Danger to Others: No Duty to Warn:no Physical Aggression / Violence:No  Access to Firearms a concern: No  Gang Involvement:No   Subjective:   Crystal Compton participated from home, via video and consented to treatment. Therapist participated from home office. We met online due to [place] pandemic. Crystal Compton reviewed the events of the past week. Crystal Compton feeling "not so good" since the last appointment. Crystal Compton noted feeling lonely and over thinking "too much". She noted being lonely in relation to friends and companionship. She noted not having friend in the area. She noted having friends (childhood) in a nearby town and noted a lack of empathy and understanding from them. She noted "waiting for something new to happen in my life". We explored this and worked on identifying how her day-to-day routine is affecting her mood including being more of a homebody, being less social, and ruminating. Crystal Compton noted her schedule appointment, for September, to meet her new psychiatric provider, Dr. Darleene Cleaver, with [facility]. Her barriers to socializing include worry that people would think poorly of her, "did I say something dumb", and "am I making sense".  We worked on identifying short-term goals including changing routine including spending more time outside, thinking more positively and adaptively, and engaging in small behavioral changes that are more inline with a balanced mood such as exercise, time outside, engaging in new activities. Therapist validated Crystal Compton's experience. Therapist aided in reframing Crystal Compton's perception of social engagements and discussed the importance of relationships being reciprocal in nature. Crystal Compton encouraged Crystal Compton to work on identifying what she looks for in relationships to be processed going forward.  Therapist praised Product manager and provided supportive therapy. Crystal Compton was scheduled for follow-up appointments.   Interventions: Psycho-education & CBT  Diagnosis:  Other depression  Treatment Plan:  Client Abilities/Strengths Crystal Compton is self-aware and motivated for change.   Support System: Family  Client Treatment Preferences Outpatient therapy.   Client Statement of Needs Crystal Compton would like to "cope" with her anxiety and depression, increasing physical activity, reducing day-to-day stress, increase socialization, build relationships with family and friends, reduce rumination, manage symptoms on a day-to-day.     Treatment Level Weekly  Symptoms  Anxiety: Rumination, physiological tension, difficulty managing worry, worrying about different things, catastrophizing, panic symptoms, increased heart-rate, palpitations.     (Status: maintained) Depression: Sadness, feeling down,  lethargy, low motivation, insomnia and middle insomnia, decreased appetite when stressed, difficulty concentrating (fluctuates), denied SI. (Status: maintained)  Goals:   Crystal Compton experiences symptoms of depression and anxiety.    Target Date: 04/16/23 Frequency: Weekly  Progress: 0 Modality: individual    Therapist will provide referrals for additional resources as appropriate.  Therapist will provide  psycho-education regarding Crystal Compton's diagnosis and corresponding treatment approaches and interventions. Licensed Clinical  Social Worker, Crystal Irish, LCSW will support the patient's ability to achieve the goals identified. will employ CBT, BA, Problem-solving, Solution Focused, Mindfulness,  coping skills, & other evidenced-based practices will be used to promote progress towards healthy functioning to help manage decrease symptoms associated with her diagnosis.   Reduce overall level, frequency, and intensity of the feelings of depression, anxiety and panic evidenced by decreased overall symptoms from 6 to 7 days/week to 0 to 1 days/week per client report for at least 3 consecutive months. Verbally express understanding of the relationship between feelings of depression, anxiety and their impact on thinking patterns and behaviors. Verbalize an understanding of the role that distorted thinking plays in creating fears, excessive worry, and ruminations.    Crystal Compton participated in the creation of the treatment plan)    Crystal Irish, LCSW

## 2022-07-08 ENCOUNTER — Ambulatory Visit: Payer: Self-pay | Admitting: Psychology

## 2022-07-22 ENCOUNTER — Encounter: Payer: BC Managed Care – PPO | Admitting: Psychology

## 2022-07-22 NOTE — Progress Notes (Signed)
This encounter was created in error - please disregard.

## 2022-10-28 ENCOUNTER — Other Ambulatory Visit (HOSPITAL_COMMUNITY): Payer: Self-pay

## 2022-12-30 ENCOUNTER — Encounter (HOSPITAL_COMMUNITY): Payer: Self-pay | Admitting: *Deleted

## 2023-08-05 ENCOUNTER — Other Ambulatory Visit: Payer: Self-pay

## 2023-08-05 ENCOUNTER — Emergency Department (HOSPITAL_BASED_OUTPATIENT_CLINIC_OR_DEPARTMENT_OTHER)
Admission: EM | Admit: 2023-08-05 | Discharge: 2023-08-05 | Disposition: A | Discharge status: Home / Self Care | Payer: BC Managed Care – PPO | Attending: Emergency Medicine | Admitting: Emergency Medicine

## 2023-08-05 ENCOUNTER — Encounter (HOSPITAL_BASED_OUTPATIENT_CLINIC_OR_DEPARTMENT_OTHER): Payer: Self-pay | Admitting: Emergency Medicine

## 2023-08-05 ENCOUNTER — Emergency Department (HOSPITAL_BASED_OUTPATIENT_CLINIC_OR_DEPARTMENT_OTHER): Payer: BC Managed Care – PPO

## 2023-08-05 DIAGNOSIS — M542 Cervicalgia: Secondary | ICD-10-CM | POA: Diagnosis present

## 2023-08-05 DIAGNOSIS — Y92410 Unspecified street and highway as the place of occurrence of the external cause: Secondary | ICD-10-CM | POA: Diagnosis not present

## 2023-08-05 DIAGNOSIS — R0789 Other chest pain: Secondary | ICD-10-CM | POA: Insufficient documentation

## 2023-08-05 DIAGNOSIS — R1031 Right lower quadrant pain: Secondary | ICD-10-CM | POA: Diagnosis not present

## 2023-08-05 DIAGNOSIS — M546 Pain in thoracic spine: Secondary | ICD-10-CM | POA: Diagnosis not present

## 2023-08-05 DIAGNOSIS — Z87891 Personal history of nicotine dependence: Secondary | ICD-10-CM | POA: Diagnosis not present

## 2023-08-05 DIAGNOSIS — R1032 Left lower quadrant pain: Secondary | ICD-10-CM | POA: Insufficient documentation

## 2023-08-05 LAB — COMPREHENSIVE METABOLIC PANEL
ALT: 11 U/L (ref 0–44)
AST: 13 U/L — ABNORMAL LOW (ref 15–41)
Albumin: 3.6 g/dL (ref 3.5–5.0)
Alkaline Phosphatase: 44 U/L (ref 38–126)
Anion gap: 10 (ref 5–15)
BUN: 10 mg/dL (ref 6–20)
CO2: 22 mmol/L (ref 22–32)
Calcium: 8.6 mg/dL — ABNORMAL LOW (ref 8.9–10.3)
Chloride: 104 mmol/L (ref 98–111)
Creatinine, Ser: 0.88 mg/dL (ref 0.44–1.00)
GFR, Estimated: >60 mL/min (ref >60)
Glucose, Bld: 97 mg/dL (ref 70–99)
Potassium: 3.9 mmol/L (ref 3.5–5.1)
Sodium: 136 mmol/L (ref 135–145)
Total Bilirubin: 0.6 mg/dL (ref 0.3–1.2)
Total Protein: 6.5 g/dL (ref 6.5–8.1)

## 2023-08-05 LAB — LIPASE, BLOOD: Lipase: 34 U/L (ref 11–51)

## 2023-08-05 LAB — I-STAT CHEM 8, ED
BUN: 9 mg/dL (ref 6–20)
Calcium, Ion: 1.21 mmol/L (ref 1.15–1.40)
Chloride: 108 mmol/L (ref 98–111)
Creatinine, Ser: 0.7 mg/dL (ref 0.44–1.00)
Glucose, Bld: 93 mg/dL (ref 70–99)
HCT: 44 % (ref 36.0–46.0)
Hemoglobin: 15 g/dL (ref 12.0–15.0)
Potassium: 4 mmol/L (ref 3.5–5.1)
Sodium: 140 mmol/L (ref 135–145)
TCO2: 24 mmol/L (ref 22–32)

## 2023-08-05 LAB — PREGNANCY, URINE: Preg Test, Ur: NEGATIVE

## 2023-08-05 MED ORDER — IOHEXOL 300 MG/ML  SOLN
100.0000 mL | Freq: Once | INTRAMUSCULAR | Status: AC | PRN
  Administered 2023-08-05: 100 mL via INTRAVENOUS

## 2023-08-05 MED ORDER — IBUPROFEN 600 MG PO TABS: 600.0000 mg | ORAL_TABLET | Freq: Four times a day (QID) | ORAL | 0 refills | Status: AC | PRN

## 2023-08-05 MED ORDER — CYCLOBENZAPRINE HCL 10 MG PO TABS: 10.0000 mg | ORAL_TABLET | Freq: Two times a day (BID) | ORAL | 0 refills | Status: DC | PRN

## 2023-08-05 NOTE — Discharge Instructions (Addendum)
As discussed, workup today overall reassuring.  Imaging studies negative for any fracture, dislocation, spinal cord injury, collapsed lung, significant abdominal organ injury.  Will recommend treatment of pain at home with NSAID as well as muscle laxer to use as needed.  No muscle laxer can cause drowsiness so please do not drive or perform any high risk activity and to realize its medications effects on you.  Recommend follow-up with primary care for reassessment of your symptoms.  Please do not hesitate to return to emergency department for worrisome signs and symptoms we discussed become apparent.

## 2023-08-05 NOTE — ED Notes (Signed)
Patient transported to CT 
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
## 2023-08-05 NOTE — ED Notes (Signed)
RT Note: I-Stat Chem 8 completed
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
## 2023-10-12 ENCOUNTER — Encounter (HOSPITAL_COMMUNITY): Payer: Self-pay | Admitting: *Deleted

## 2024-08-12 ENCOUNTER — Other Ambulatory Visit: Payer: Self-pay

## 2024-08-12 ENCOUNTER — Encounter (HOSPITAL_BASED_OUTPATIENT_CLINIC_OR_DEPARTMENT_OTHER): Payer: Self-pay | Admitting: Emergency Medicine

## 2024-08-12 ENCOUNTER — Emergency Department (HOSPITAL_BASED_OUTPATIENT_CLINIC_OR_DEPARTMENT_OTHER)
Admission: EM | Admit: 2024-08-12 | Discharge: 2024-08-12 | Disposition: A | Discharge status: Home / Self Care | Attending: Emergency Medicine | Admitting: Emergency Medicine

## 2024-08-12 DIAGNOSIS — M542 Cervicalgia: Secondary | ICD-10-CM | POA: Diagnosis present

## 2024-08-12 DIAGNOSIS — Y92410 Unspecified street and highway as the place of occurrence of the external cause: Secondary | ICD-10-CM | POA: Diagnosis not present

## 2024-08-12 MED ORDER — METHOCARBAMOL 500 MG PO TABS: 500.0000 mg | ORAL_TABLET | Freq: Two times a day (BID) | ORAL | 0 refills | Status: AC | PRN

## 2024-08-12 MED ORDER — OXYCODONE HCL 5 MG PO TABS: 5.0000 mg | ORAL_TABLET | Freq: Four times a day (QID) | ORAL | 0 refills | Status: AC | PRN

## 2024-08-12 MED ORDER — LIDOCAINE 5 % EX PTCH: 1.0000 | MEDICATED_PATCH | CUTANEOUS | 0 refills | Status: AC

## 2024-08-12 MED ORDER — ACETAMINOPHEN 325 MG PO TABS
650.0000 mg | ORAL_TABLET | Freq: Once | ORAL | Status: AC
  Administered 2024-08-12: 650 mg via ORAL
  Filled 2024-08-12: qty 2

## 2024-08-12 MED ORDER — LIDOCAINE 5 % EX PTCH
1.0000 | MEDICATED_PATCH | CUTANEOUS | Status: DC
  Administered 2024-08-12: 1 via TRANSDERMAL
  Filled 2024-08-12: qty 1
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
## 2024-10-10 ENCOUNTER — Encounter (HOSPITAL_COMMUNITY): Payer: Self-pay | Admitting: *Deleted
